# Patient Record
Sex: Male | Born: 1970 | Race: Asian | Hispanic: No | Marital: Married | Smoking: Never smoker
Health system: Southern US, Community
[De-identification: ages and names within clinical notes are randomized; demographics above are authoritative.]

## PROBLEM LIST (undated history)

## (undated) DIAGNOSIS — N410 Acute prostatitis: Secondary | ICD-10-CM

## (undated) DIAGNOSIS — B9681 Helicobacter pylori [H. pylori] as the cause of diseases classified elsewhere: Secondary | ICD-10-CM

## (undated) DIAGNOSIS — J309 Allergic rhinitis, unspecified: Secondary | ICD-10-CM

## (undated) DIAGNOSIS — K259 Gastric ulcer, unspecified as acute or chronic, without hemorrhage or perforation: Secondary | ICD-10-CM

## (undated) DIAGNOSIS — N419 Inflammatory disease of prostate, unspecified: Secondary | ICD-10-CM

## (undated) DIAGNOSIS — E785 Hyperlipidemia, unspecified: Secondary | ICD-10-CM

## (undated) DIAGNOSIS — K219 Gastro-esophageal reflux disease without esophagitis: Secondary | ICD-10-CM

## (undated) DIAGNOSIS — M549 Dorsalgia, unspecified: Secondary | ICD-10-CM

## (undated) DIAGNOSIS — E781 Pure hyperglyceridemia: Secondary | ICD-10-CM

## (undated) DIAGNOSIS — M26629 Arthralgia of temporomandibular joint, unspecified side: Secondary | ICD-10-CM

## (undated) DIAGNOSIS — K2970 Gastritis, unspecified, without bleeding: Secondary | ICD-10-CM

## (undated) HISTORY — DX: Allergic rhinitis, unspecified: J30.9

## (undated) HISTORY — DX: Hyperlipidemia, unspecified: E78.5

## (undated) HISTORY — DX: Gastro-esophageal reflux disease without esophagitis: K21.9

## (undated) HISTORY — DX: Helicobacter pylori (H. pylori) as the cause of diseases classified elsewhere: B96.81

## (undated) HISTORY — DX: Gastric ulcer, unspecified as acute or chronic, without hemorrhage or perforation: K25.9

## (undated) HISTORY — DX: Gastritis, unspecified, without bleeding: K29.70

## (undated) HISTORY — PX: UPPER GASTROINTESTINAL ENDOSCOPY: SHX188

## (undated) HISTORY — DX: Arthralgia of temporomandibular joint, unspecified side: M26.629

## (undated) HISTORY — DX: Pure hyperglyceridemia: E78.1

## (undated) HISTORY — DX: Acute prostatitis: N41.0

---
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
## 2006-06-12 HISTORY — PX: CYSTECTOMY: SUR359

## 2010-05-19 ENCOUNTER — Emergency Department (HOSPITAL_BASED_OUTPATIENT_CLINIC_OR_DEPARTMENT_OTHER): Admission: EM | Admit: 2010-05-19 | Discharge: 2010-04-25 | Payer: Self-pay | Admitting: Emergency Medicine

## 2010-06-23 ENCOUNTER — Ambulatory Visit
Admission: RE | Admit: 2010-06-23 | Discharge: 2010-06-23 | Payer: Self-pay | Source: Home / Self Care | Attending: Internal Medicine | Admitting: Internal Medicine

## 2010-06-23 ENCOUNTER — Encounter: Payer: Self-pay | Admitting: Internal Medicine

## 2010-06-23 DIAGNOSIS — K2970 Gastritis, unspecified, without bleeding: Secondary | ICD-10-CM | POA: Insufficient documentation

## 2010-06-24 ENCOUNTER — Encounter: Payer: Self-pay | Admitting: Internal Medicine

## 2010-06-24 LAB — CONVERTED CEMR LAB: Direct LDL: 68 mg/dL

## 2010-06-27 ENCOUNTER — Telehealth: Payer: Self-pay | Admitting: Internal Medicine

## 2010-06-28 ENCOUNTER — Encounter: Payer: Self-pay | Admitting: Internal Medicine

## 2010-06-28 LAB — CONVERTED CEMR LAB
ALT: 53 units/L (ref 0–53)
AST: 30 units/L (ref 0–37)
Alkaline Phosphatase: 106 units/L (ref 39–117)
Bilirubin, Direct: 0.1 mg/dL (ref 0.0–0.3)
Cholesterol: 169 mg/dL (ref 0–200)
HDL: 44 mg/dL (ref >39)
Helicobacter Pylori Antibody-IgG: <0.4
Total CHOL/HDL Ratio: 3.8
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
## 2010-07-14 NOTE — Progress Notes (Signed)
Summary: Blood Work  ---- [facility] ---- ---- 06/24/2010 4:32 PM, D. Thomos Lemons DO wrote: plz ask pt whether he was completely fasting before recent blood test ------------------------------  Phone Note Outgoing Call   Call placed by: Glendell Docker CMA,  June 27, 2010 11:28 AM Call placed to: Patient Summary of Call: call placed to patient at [phone], no answer. A voice message was left for patient to return call regarding blood work Initial call taken by: Glendell Docker CMA,  June 27, 2010 11:30 AM  Follow-up for Phone Call        Patient returned phone call, and patient states that he was not fasting. [phone]) Follow-up by: Glendell Docker CMA,  June 28, 2010 8:25 AM

## 2010-07-14 NOTE — Letter (Signed)
   [place] [facility] [address] [place], [place]  [address]  [place] Phone: [phone]      June 28, 2010   Asriel Mccune [address] [place], [place] [address]  RE:  LAB RESULTS  Dear  Mr. NUTTALL,  The following is an interpretation of your most recent lab tests.  Please take note of any instructions provided or changes to medications that have resulted from your lab work.  LIVER FUNCTION TESTS:  Good - no changes needed  LIPID PANEL:  Fair - review at your next visit Triglyceride: 409   Cholesterol: 169   LDL: See Comment mg/dL   HDL: 44   Chol/HDL%:  3.8 Ratio  C Reactive protein - normal  H. Pylori antibody - negative       Sincerely Yours,    Dr. Thomos Lemons  Appended Document:  mailed

## 2010-08-23 LAB — COMPREHENSIVE METABOLIC PANEL
ALT: 29 U/L (ref 0–53)
Alkaline Phosphatase: 93 U/L (ref 39–117)
BUN: 15 mg/dL (ref 6–23)
CO2: 28 mEq/L (ref 19–32)
Chloride: 104 mEq/L (ref 96–112)
Glucose, Bld: 99 mg/dL (ref 70–99)
Potassium: 3.3 mEq/L — ABNORMAL LOW (ref 3.5–5.1)
Sodium: 142 mEq/L (ref 135–145)
Total Bilirubin: 0.9 mg/dL (ref 0.3–1.2)

## 2010-08-23 LAB — CBC
HCT: 43.6 % (ref 39.0–52.0)
Hemoglobin: 15.3 g/dL (ref 13.0–17.0)
MCV: 88.2 fL (ref 78.0–100.0)
RBC: 4.94 MIL/uL (ref 4.22–5.81)
WBC: 7.7 10*3/uL (ref 4.0–10.5)

## 2010-08-23 LAB — DIFFERENTIAL
Basophils Absolute: 0 10*3/uL (ref 0.0–0.1)
Basophils Relative: 0 % (ref 0–1)
Eosinophils Absolute: 0.1 10*3/uL (ref 0.0–0.7)
Neutro Abs: 4.8 10*3/uL (ref 1.7–7.7)
Neutrophils Relative %: 62 % (ref 43–77)

## 2010-08-23 LAB — URINALYSIS, ROUTINE W REFLEX MICROSCOPIC
Glucose, UA: NEGATIVE mg/dL
Hgb urine dipstick: NEGATIVE
Protein, ur: NEGATIVE mg/dL
Specific Gravity, Urine: 1.014 (ref 1.005–1.030)
pH: 5.5 (ref 5.0–8.0)

## 2010-08-23 LAB — LIPASE, BLOOD: Lipase: 94 U/L (ref 23–300)

## 2010-11-16 ENCOUNTER — Encounter: Payer: Self-pay | Admitting: Internal Medicine

## 2010-11-22 ENCOUNTER — Ambulatory Visit: Payer: Self-pay | Admitting: Internal Medicine

## 2010-11-29 ENCOUNTER — Telehealth: Payer: Self-pay | Admitting: *Deleted

## 2010-11-29 ENCOUNTER — Encounter: Payer: Self-pay | Admitting: Family

## 2010-11-29 ENCOUNTER — Ambulatory Visit (INDEPENDENT_AMBULATORY_CARE_PROVIDER_SITE_OTHER): Payer: 59 | Admitting: Family

## 2010-11-29 VITALS — BP 118/82 | HR 72 | Temp 98.0°F | Resp 16 | Ht 63.0 in | Wt 131.0 lb

## 2010-11-29 DIAGNOSIS — E781 Pure hyperglyceridemia: Secondary | ICD-10-CM | POA: Insufficient documentation

## 2010-11-29 DIAGNOSIS — E785 Hyperlipidemia, unspecified: Secondary | ICD-10-CM

## 2010-11-29 MED ORDER — OMEGA-3 FATTY ACIDS 1000 MG PO CAPS: 2.0000 g | ORAL_CAPSULE | Freq: Two times a day (BID) | ORAL | Status: AC

## 2010-11-29 MED ORDER — OMEPRAZOLE MAGNESIUM 20 MG PO TBEC: 20.0000 mg | DELAYED_RELEASE_TABLET | Freq: Every day | ORAL | Status: DC
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
## 2010-11-29 NOTE — Telephone Encounter (Signed)
Order entered and forwarded to lab for lipid panel in 3 months per Sandford Craze, NP.

## 2010-11-29 NOTE — Patient Instructions (Signed)
Follow up in 3 months. Go to the lab 1 week prior to this appointment to complete your Fasting Cholesterol.

## 2010-11-29 NOTE — Assessment & Plan Note (Signed)
Triglycerides > 400 last visit. We discussed dietary modification including substituting whole grained bread/rice and limiting portions.  Will add a fish oil supplement and have the pt return for FLP in 3 months.

## 2011-02-28 ENCOUNTER — Other Ambulatory Visit: Payer: Self-pay | Admitting: Internal Medicine

## 2011-02-28 ENCOUNTER — Ambulatory Visit (INDEPENDENT_AMBULATORY_CARE_PROVIDER_SITE_OTHER): Payer: 59 | Admitting: Internal Medicine

## 2011-02-28 ENCOUNTER — Encounter: Payer: Self-pay | Admitting: Internal Medicine

## 2011-02-28 VITALS — BP 106/70 | HR 53 | Temp 97.9°F | Resp 16 | Wt 129.0 lb

## 2011-02-28 DIAGNOSIS — E781 Pure hyperglyceridemia: Secondary | ICD-10-CM

## 2011-02-28 DIAGNOSIS — E785 Hyperlipidemia, unspecified: Secondary | ICD-10-CM

## 2011-02-28 DIAGNOSIS — K219 Gastro-esophageal reflux disease without esophagitis: Secondary | ICD-10-CM

## 2011-02-28 NOTE — Assessment & Plan Note (Signed)
Change to omeprazole for cost consideration. Notify clinic of breakthrough sx's

## 2011-02-28 NOTE — Assessment & Plan Note (Signed)
Obtain lipid/lft. 
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
## 2011-03-01 LAB — LIPID PANEL: LDL Cholesterol: 70 mg/dL (ref 0–99)

## 2011-03-01 LAB — HEPATIC FUNCTION PANEL
AST: 20 U/L (ref 0–37)
Albumin: 4.6 g/dL (ref 3.5–5.2)
Alkaline Phosphatase: 119 U/L — ABNORMAL HIGH (ref 39–117)
Bilirubin, Direct: 0.1 mg/dL (ref 0.0–0.3)
Indirect Bilirubin: 0.7 mg/dL (ref 0.0–0.9)
Total Bilirubin: 0.8 mg/dL (ref 0.3–1.2)

## 2011-06-08 ENCOUNTER — Telehealth: Payer: Self-pay | Admitting: Internal Medicine

## 2011-06-08 NOTE — Telephone Encounter (Signed)
Pt requesting to get back on esomeprazole (NEXIUM) 40 MG capsule pt was recently switched to a new medication and is requesting to get back on esomeprazole (NEXIUM) 40 MG capsule  Pt said new medication is not working  Please contact pt

## 2011-06-09 MED ORDER — ESOMEPRAZOLE MAGNESIUM 40 MG PO CPDR: 40.0000 mg | DELAYED_RELEASE_CAPSULE | Freq: Every day | ORAL | Status: DC

## 2011-06-09 NOTE — Telephone Encounter (Signed)
rx sent in electronically, pt aware 

## 2011-06-09 NOTE — Telephone Encounter (Signed)
Ok to switch back to nexium 40 mg  # 90   One po am 15-30 mins before breakfast.  RF X 1

## 2012-03-13 ENCOUNTER — Telehealth: Payer: Self-pay | Admitting: Internal Medicine

## 2012-03-13 MED ORDER — ESOMEPRAZOLE MAGNESIUM 40 MG PO CPDR: 40.0000 mg | DELAYED_RELEASE_CAPSULE | Freq: Every day | ORAL | Status: DC

## 2012-03-13 NOTE — Telephone Encounter (Signed)
Patient called stating that he need a refill of his nexium sent to [facility]. Please assist.

## 2012-03-18 ENCOUNTER — Other Ambulatory Visit: Payer: Self-pay | Admitting: Internal Medicine

## 2012-04-11 ENCOUNTER — Ambulatory Visit (INDEPENDENT_AMBULATORY_CARE_PROVIDER_SITE_OTHER): Payer: 59 | Admitting: Internal Medicine

## 2012-04-11 ENCOUNTER — Encounter: Payer: Self-pay | Admitting: Internal Medicine

## 2012-04-11 VITALS — BP 122/84 | HR 56 | Temp 97.9°F | Resp 14 | Ht 63.5 in | Wt 130.2 lb

## 2012-04-11 DIAGNOSIS — E781 Pure hyperglyceridemia: Secondary | ICD-10-CM

## 2012-04-11 DIAGNOSIS — R109 Unspecified abdominal pain: Secondary | ICD-10-CM

## 2012-04-11 DIAGNOSIS — E785 Hyperlipidemia, unspecified: Secondary | ICD-10-CM

## 2012-04-11 DIAGNOSIS — K2970 Gastritis, unspecified, without bleeding: Secondary | ICD-10-CM

## 2012-04-11 LAB — HEPATIC FUNCTION PANEL
AST: 27 U/L (ref 0–37)
Albumin: 4.4 g/dL (ref 3.5–5.2)
Bilirubin, Direct: 0.2 mg/dL (ref 0.0–0.3)
Total Bilirubin: 0.9 mg/dL (ref 0.3–1.2)

## 2012-04-11 LAB — CBC WITH DIFFERENTIAL/PLATELET
Basophils Absolute: 0.1 10*3/uL (ref 0.0–0.1)
HCT: 43.1 % (ref 39.0–52.0)
Lymphocytes Relative: 42 % (ref 12–46)
Lymphs Abs: 2.6 10*3/uL (ref 0.7–4.0)
MCV: 86 fL (ref 78.0–100.0)
Monocytes Absolute: 0.5 10*3/uL (ref 0.1–1.0)
Neutro Abs: 2.9 10*3/uL (ref 1.7–7.7)
RBC: 5.01 MIL/uL (ref 4.22–5.81)
RDW: 13 % (ref 11.5–15.5)
WBC: 6.1 10*3/uL (ref 4.0–10.5)

## 2012-04-11 LAB — BASIC METABOLIC PANEL
BUN: 12 mg/dL (ref 6–23)
CO2: 30 mEq/L (ref 19–32)
Chloride: 101 mEq/L (ref 96–112)
Potassium: 4.1 mEq/L (ref 3.5–5.3)

## 2012-04-11 LAB — LIPID PANEL
HDL: 43 mg/dL (ref >39)
LDL Cholesterol: 77 mg/dL (ref 0–99)
Total CHOL/HDL Ratio: 4 Ratio
VLDL: 54 mg/dL — ABNORMAL HIGH (ref 0–40)

## 2012-04-11 MED ORDER — ESOMEPRAZOLE MAGNESIUM 40 MG PO CPDR: 40.0000 mg | DELAYED_RELEASE_CAPSULE | Freq: Every day | ORAL | Status: DC
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
## 2012-04-14 NOTE — Assessment & Plan Note (Signed)
Continue Nexium and refill provided. Followup if symptoms do not resolve over next two weeks.

## 2012-04-14 NOTE — Assessment & Plan Note (Signed)
Obtain fasting lipid profile and liver function tests. 

## 2012-04-24 ENCOUNTER — Encounter: Payer: Self-pay | Admitting: *Deleted

## 2012-04-28 ENCOUNTER — Encounter (HOSPITAL_COMMUNITY): Payer: Self-pay | Admitting: Emergency Medicine

## 2012-04-28 ENCOUNTER — Emergency Department (HOSPITAL_COMMUNITY)
Admission: EM | Admit: 2012-04-28 | Discharge: 2012-04-28 | Disposition: A | Discharge status: Home / Self Care | Payer: 59 | Source: Home / Self Care

## 2012-04-28 DIAGNOSIS — S0500XA Injury of conjunctiva and corneal abrasion without foreign body, unspecified eye, initial encounter: Secondary | ICD-10-CM

## 2012-04-28 DIAGNOSIS — S058X9A Other injuries of unspecified eye and orbit, initial encounter: Secondary | ICD-10-CM

## 2012-04-28 MED ORDER — HYDROCODONE-ACETAMINOPHEN 5-325 MG PO TABS: 2.0000 | ORAL_TABLET | ORAL | Status: DC | PRN

## 2012-04-28 MED ORDER — TOBRAMYCIN 0.3 % OP SOLN: 1.0000 [drp] | OPHTHALMIC | Status: DC

## 2012-04-28 NOTE — ED Notes (Signed)
Reports foreign object hit eye.  Reports watery eyes and pain.

## 2012-04-28 NOTE — ED Provider Notes (Signed)
Medical screening examination/treatment/procedure(s) were performed by non-physician practitioner and as supervising physician I was immediately available for consultation/collaboration.  Leslee Home, M.D.   Reuben Likes, MD 04/28/12 [phone]
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
## 2012-06-14 ENCOUNTER — Encounter: Payer: Self-pay | Admitting: Internal Medicine

## 2012-06-14 ENCOUNTER — Ambulatory Visit (INDEPENDENT_AMBULATORY_CARE_PROVIDER_SITE_OTHER): Payer: 59 | Admitting: Internal Medicine

## 2012-06-14 VITALS — BP 110/80 | HR 63 | Temp 98.2°F | Resp 16 | Wt 131.2 lb

## 2012-06-14 DIAGNOSIS — N419 Inflammatory disease of prostate, unspecified: Secondary | ICD-10-CM

## 2012-06-14 DIAGNOSIS — R300 Dysuria: Secondary | ICD-10-CM

## 2012-06-14 DIAGNOSIS — R82998 Other abnormal findings in urine: Secondary | ICD-10-CM

## 2012-06-14 DIAGNOSIS — R350 Frequency of micturition: Secondary | ICD-10-CM

## 2012-06-14 DIAGNOSIS — R8290 Unspecified abnormal findings in urine: Secondary | ICD-10-CM

## 2012-06-14 HISTORY — DX: Inflammatory disease of prostate, unspecified: N41.9

## 2012-06-14 LAB — POCT URINALYSIS DIPSTICK
Bilirubin, UA: NEGATIVE
Glucose, UA: NEGATIVE
Ketones, UA: NEGATIVE
Leukocytes, UA: NEGATIVE
Nitrite, UA: NEGATIVE
Protein, UA: NEGATIVE
Spec Grav, UA: 1.015
Urobilinogen, UA: 0.2
pH, UA: 6.5

## 2012-06-14 MED ORDER — ESOMEPRAZOLE MAGNESIUM 40 MG PO CPDR: 40.0000 mg | DELAYED_RELEASE_CAPSULE | Freq: Every day | ORAL | Status: DC

## 2012-06-14 MED ORDER — CIPROFLOXACIN HCL 500 MG PO TABS: 500.0000 mg | ORAL_TABLET | Freq: Two times a day (BID) | ORAL | Status: DC

## 2012-06-14 NOTE — Assessment & Plan Note (Signed)
Suspected prostatitis. ua remarkable for blood only. No hx of kidney stones. Send urine for cx. Begin 10d course of cipro. Followup if no improvement or worsening.
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
## 2012-06-14 NOTE — Addendum Note (Signed)
Addended by: Regis Bill on: 06/14/2012 10:00 AM   Modules accepted: Orders

## 2012-06-16 LAB — URINE CULTURE
Colony Count: NO GROWTH
Organism ID, Bacteria: NO GROWTH

## 2012-09-20 ENCOUNTER — Encounter: Payer: Self-pay | Admitting: Internal Medicine

## 2012-10-01 ENCOUNTER — Encounter: Payer: Self-pay | Admitting: Family

## 2012-10-01 ENCOUNTER — Ambulatory Visit (INDEPENDENT_AMBULATORY_CARE_PROVIDER_SITE_OTHER): Payer: 59 | Admitting: Family

## 2012-10-01 VITALS — BP 124/90 | HR 69 | Temp 97.9°F | Resp 16 | Ht 63.5 in | Wt 129.0 lb

## 2012-10-01 DIAGNOSIS — J309 Allergic rhinitis, unspecified: Secondary | ICD-10-CM

## 2012-10-01 MED ORDER — FLUTICASONE PROPIONATE 50 MCG/ACT NA SUSP: 2.0000 | Freq: Every day | NASAL | Status: DC

## 2012-10-01 NOTE — Patient Instructions (Addendum)
Allergic Rhinitis  Allergic rhinitis is when the mucous membranes in the nose respond to allergens. Allergens are particles in the air that cause your body to have an allergic reaction. This causes you to release allergic antibodies. Through a chain of events, these eventually cause you to release histamine into the blood stream (hence the use of antihistamines). Although meant to be protective to the body, it is this release that causes your discomfort, such as frequent sneezing, congestion and an itchy runny nose.    CAUSES    The pollen allergens may come from grasses, trees, and weeds. This is seasonal allergic rhinitis, or "hay fever." Other allergens cause year-round allergic rhinitis (perennial allergic rhinitis) such as house dust mite allergen, pet dander and mold spores.    SYMPTOMS     Nasal stuffiness (congestion).   Runny, itchy nose with sneezing and tearing of the eyes.   There is often an itching of the mouth, eyes and ears.  It cannot be cured, but it can be controlled with medications.  DIAGNOSIS    If you are unable to determine the offending allergen, skin or blood testing may find it.  TREATMENT     Avoid the allergen.   Medications and allergy shots (immunotherapy) can help.   Hay fever may often be treated with antihistamines in pill or nasal spray forms. Antihistamines block the effects of histamine. There are over-the-counter medicines that may help with nasal congestion and swelling around the eyes. Check with your caregiver before taking or giving this medicine.  If the treatment above does not work, there are many new medications your caregiver can prescribe. Stronger medications may be used if initial measures are ineffective. Desensitizing injections can be used if medications and avoidance fails. Desensitization is when a patient is given ongoing shots until the body becomes less sensitive to the allergen. Make sure you follow up with your caregiver if problems continue.   SEEK MEDICAL CARE IF:     You develop fever (more than 100.5 F (38.1 C).   You develop a cough that does not stop easily (persistent).   You have shortness of breath.   You start wheezing.   Symptoms interfere with normal daily activities.  Document Released: 02/21/2001 Document Revised: 08/21/2011 Document Reviewed: 09/02/2008  Exit[employer] Patient Information 2013 [employer].
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
## 2012-10-05 DIAGNOSIS — J309 Allergic rhinitis, unspecified: Secondary | ICD-10-CM | POA: Insufficient documentation

## 2012-10-05 NOTE — Assessment & Plan Note (Signed)
Change to zyrtec, add flonase.  Call if symptoms worsen or do not improve.

## 2012-10-10 ENCOUNTER — Ambulatory Visit: Payer: 59 | Admitting: Internal Medicine

## 2012-10-11 ENCOUNTER — Encounter: Payer: Self-pay | Admitting: Family

## 2012-10-11 ENCOUNTER — Ambulatory Visit (INDEPENDENT_AMBULATORY_CARE_PROVIDER_SITE_OTHER): Payer: 59 | Admitting: Family

## 2012-10-11 VITALS — BP 126/82 | HR 62 | Temp 97.9°F | Resp 16 | Ht 63.5 in | Wt 130.0 lb

## 2012-10-11 DIAGNOSIS — E781 Pure hyperglyceridemia: Secondary | ICD-10-CM

## 2012-10-11 DIAGNOSIS — J309 Allergic rhinitis, unspecified: Secondary | ICD-10-CM

## 2012-10-11 DIAGNOSIS — K219 Gastro-esophageal reflux disease without esophagitis: Secondary | ICD-10-CM

## 2012-10-11 NOTE — Assessment & Plan Note (Signed)
Stable with zyrtec only.

## 2012-10-11 NOTE — Patient Instructions (Addendum)
Please follow up in 3 months for a complete physical.

## 2012-10-11 NOTE — Assessment & Plan Note (Signed)
Stable, only using nexium prn.

## 2012-10-11 NOTE — Assessment & Plan Note (Signed)
Pt is on fish oil and working on diet.  He will return fasting for FLP.
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
## 2012-10-16 LAB — LIPID PANEL
HDL: 46 mg/dL (ref >39)
Triglycerides: 120 mg/dL (ref <150)

## 2012-10-17 ENCOUNTER — Encounter: Payer: Self-pay | Admitting: Family

## 2013-03-10 ENCOUNTER — Encounter: Payer: Self-pay | Admitting: Family

## 2013-03-10 ENCOUNTER — Ambulatory Visit (INDEPENDENT_AMBULATORY_CARE_PROVIDER_SITE_OTHER): Payer: 59 | Admitting: Family

## 2013-03-10 VITALS — BP 106/80 | HR 55 | Temp 98.1°F | Resp 16 | Ht 63.5 in | Wt 124.1 lb

## 2013-03-10 DIAGNOSIS — M26629 Arthralgia of temporomandibular joint, unspecified side: Secondary | ICD-10-CM

## 2013-03-10 MED ORDER — CYCLOBENZAPRINE HCL 5 MG PO TABS: 5.0000 mg | ORAL_TABLET | Freq: Every day | ORAL | Status: DC

## 2013-03-10 MED ORDER — MELOXICAM 7.5 MG PO TABS: 7.5000 mg | ORAL_TABLET | Freq: Every day | ORAL | Status: DC

## 2013-03-10 NOTE — Assessment & Plan Note (Signed)
Trial of meloxicam, HS flexeril and HS otc mouth guard.  If not improvement could consider neck ultrasound to further evaluate.

## 2013-03-10 NOTE — Patient Instructions (Addendum)
Purchase a night time mouth guard at the drug store and wear each night while sleeping. Take meloxicam daily for 1-2 weeks- this is an anti-inflammatory. You may use flexeril at bedtime.   Follow up in 1 month.   Temporomandibular Joint Pain Your exam shows that you have a problem with your temporomandibular joint (TMJ), the joint that moves when you open your mouth or chew food. TMJ problems can result from direct injuries, bite abnormalities, or tension states which cause you to grind or clench your teeth. Typical symptoms include pain around the joint, clicking, restricted movement, and headaches. The TMJ is like any other joint in the body; when it is strained, it needs rest to repair itself. To keep the joint at rest it is important that you do not open your mouth wider than the width of your index finger. If you must yawn, be sure to support your chin with your hand so your mouth does not open wide. Eat a soft diet (nothing firmer than ground beef, no raw vegetables), do not chew gum and do not talk if it causes you pain. Apply topical heat by using a warm, moist cloth placed in front of the ear for 15 to 20 minutes several times daily. Alternating heat and ice may give even more relief. Anti-inflammatory pain medicine and muscle relaxants can also be helpful. A dental orthotic or splint may be used for temporary relief. Long-term problems may require treatment for stress as well as braces or surgery. Please check with your doctor or dentist if your symptoms do not improve within one week. Document Released: 07/06/2004 Document Revised: 08/21/2011 Document Reviewed: 05/29/2005 [facility] Patient Information 2014 [place], [place].
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
## 2013-04-19 ENCOUNTER — Emergency Department (HOSPITAL_COMMUNITY)
Admission: EM | Admit: 2013-04-19 | Discharge: 2013-04-19 | Disposition: A | Discharge status: Home / Self Care | Payer: 59 | Source: Home / Self Care | Attending: Family Medicine | Admitting: Family Medicine

## 2013-04-19 ENCOUNTER — Encounter (HOSPITAL_COMMUNITY): Payer: Self-pay | Admitting: Emergency Medicine

## 2013-04-19 DIAGNOSIS — J312 Chronic pharyngitis: Secondary | ICD-10-CM

## 2013-04-19 MED ORDER — CLINDAMYCIN HCL 300 MG PO CAPS: 300.0000 mg | ORAL_CAPSULE | Freq: Three times a day (TID) | ORAL | Status: DC

## 2013-04-19 NOTE — ED Notes (Signed)
Pt  Reports  Swelling  l  Side  Of  Neck   With  Pain  When  He  Swallows       X  1  Week   -  Pt  Has  Had  The  Problem  Off  And  On  For       10  Years         Sitting  Upright on  Exam table  In no  Acute  Distress
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
## 2013-04-22 LAB — CULTURE, GROUP A STREP

## 2013-10-10 ENCOUNTER — Emergency Department (HOSPITAL_BASED_OUTPATIENT_CLINIC_OR_DEPARTMENT_OTHER): Payer: 59

## 2013-10-10 ENCOUNTER — Emergency Department (HOSPITAL_BASED_OUTPATIENT_CLINIC_OR_DEPARTMENT_OTHER)
Admission: EM | Admit: 2013-10-10 | Discharge: 2013-10-10 | Disposition: A | Discharge status: Home / Self Care | Payer: 59 | Attending: Emergency Medicine | Admitting: Emergency Medicine

## 2013-10-10 ENCOUNTER — Encounter (HOSPITAL_BASED_OUTPATIENT_CLINIC_OR_DEPARTMENT_OTHER): Payer: Self-pay | Admitting: Emergency Medicine

## 2013-10-10 DIAGNOSIS — Z791 Long term (current) use of non-steroidal anti-inflammatories (NSAID): Secondary | ICD-10-CM | POA: Insufficient documentation

## 2013-10-10 DIAGNOSIS — J209 Acute bronchitis, unspecified: Secondary | ICD-10-CM | POA: Insufficient documentation

## 2013-10-10 DIAGNOSIS — Z792 Long term (current) use of antibiotics: Secondary | ICD-10-CM | POA: Insufficient documentation

## 2013-10-10 DIAGNOSIS — Z8619 Personal history of other infectious and parasitic diseases: Secondary | ICD-10-CM | POA: Insufficient documentation

## 2013-10-10 DIAGNOSIS — Z8719 Personal history of other diseases of the digestive system: Secondary | ICD-10-CM | POA: Insufficient documentation

## 2013-10-10 DIAGNOSIS — Z87828 Personal history of other (healed) physical injury and trauma: Secondary | ICD-10-CM | POA: Insufficient documentation

## 2013-10-10 DIAGNOSIS — Z79899 Other long term (current) drug therapy: Secondary | ICD-10-CM | POA: Insufficient documentation

## 2013-10-10 MED ORDER — ALBUTEROL SULFATE HFA 108 (90 BASE) MCG/ACT IN AERS
2.0000 | INHALATION_SPRAY | RESPIRATORY_TRACT | Status: DC | PRN
  Administered 2013-10-10: 2 via RESPIRATORY_TRACT
  Filled 2013-10-10: qty 6.7

## 2013-10-10 MED ORDER — ALBUTEROL SULFATE (2.5 MG/3ML) 0.083% IN NEBU
2.5000 mg | INHALATION_SOLUTION | Freq: Once | RESPIRATORY_TRACT | Status: AC
  Administered 2013-10-10: 5 mg via RESPIRATORY_TRACT

## 2013-10-10 MED ORDER — IPRATROPIUM-ALBUTEROL 0.5-2.5 (3) MG/3ML IN SOLN: 3.0000 mL | Freq: Once | RESPIRATORY_TRACT | Status: DC

## 2013-10-10 MED ORDER — DEXAMETHASONE 10 MG/ML FOR PEDIATRIC ORAL USE
10.0000 mg | Freq: Once | INTRAMUSCULAR | Status: AC
  Administered 2013-10-10: 10 mg via ORAL
  Filled 2013-10-10: qty 1

## 2013-10-10 MED ORDER — ALBUTEROL SULFATE (2.5 MG/3ML) 0.083% IN NEBU
INHALATION_SOLUTION | RESPIRATORY_TRACT | Status: AC
  Administered 2013-10-10: 5 mg via RESPIRATORY_TRACT
  Filled 2013-10-10: qty 6

## 2013-10-10 MED ORDER — DEXAMETHASONE SODIUM PHOSPHATE 10 MG/ML IJ SOLN
INTRAMUSCULAR | Status: AC
  Filled 2013-10-10: qty 1

## 2013-10-10 MED ORDER — IPRATROPIUM BROMIDE 0.02 % IN SOLN
RESPIRATORY_TRACT | Status: AC
  Administered 2013-10-10: 0.5 mg
  Filled 2013-10-10: qty 2.5

## 2013-10-10 NOTE — Discharge Instructions (Signed)
Acute Bronchitis Bronchitis is inflammation of the airways that extend from the windpipe into the lungs (bronchi). The inflammation often causes mucus to develop. This leads to a cough, which is the most common symptom of bronchitis.  In acute bronchitis, the condition usually develops suddenly and goes away over time, usually in a couple weeks. Smoking, allergies, and asthma can make bronchitis worse. Repeated episodes of bronchitis may cause further lung problems.  CAUSES Acute bronchitis is most often caused by the same virus that causes a cold. The virus can spread from person to person (contagious).  SIGNS AND SYMPTOMS   Cough.   Fever.   Coughing up mucus.   Body aches.   Chest congestion.   Chills.   Shortness of breath.   Sore throat.  DIAGNOSIS  Acute bronchitis is usually diagnosed through a physical exam. Tests, such as chest X-rays, are sometimes done to rule out other conditions.  TREATMENT  Acute bronchitis usually goes away in a couple weeks. Often times, no medical treatment is necessary. Medicines are sometimes given for relief of fever or cough. Antibiotics are usually not needed but may be prescribed in certain situations. In some cases, an inhaler may be recommended to help reduce shortness of breath and control the cough. A cool mist vaporizer may also be used to help thin bronchial secretions and make it easier to clear the chest.  HOME CARE INSTRUCTIONS  Get plenty of rest.   Drink enough fluids to keep your urine clear or pale yellow (unless you have a medical condition that requires fluid restriction). Increasing fluids may help thin your secretions and will prevent dehydration.   Only take over-the-counter or prescription medicines as directed by your health care provider.   Avoid smoking and secondhand smoke. Exposure to cigarette smoke or irritating chemicals will make bronchitis worse. If you are a smoker, consider using nicotine gum or skin  patches to help control withdrawal symptoms. Quitting smoking will help your lungs heal faster.   Reduce the chances of another bout of acute bronchitis by washing your hands frequently, avoiding people with cold symptoms, and trying not to touch your hands to your mouth, nose, or eyes.   Follow up with your health care provider as directed.  SEEK MEDICAL CARE IF: Your symptoms do not improve after 1 week of treatment.  SEEK IMMEDIATE MEDICAL CARE IF:  You develop an increased fever or chills.   You have chest pain.   You have severe shortness of breath.  You have bloody sputum.   You develop dehydration.  You develop fainting.  You develop repeated vomiting.  You develop a severe headache. MAKE SURE YOU:   Understand these instructions.  Will watch your condition.  Will get help right away if you are not doing well or get worse. Document Released: 07/06/2004 Document Revised: 01/29/2013 Document Reviewed: 11/19/2012 [employer] Patient Information 2014 [employer].  
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
## 2013-10-10 NOTE — ED Notes (Signed)
Productive cough x3 weeks;

## 2013-10-21 ENCOUNTER — Ambulatory Visit (INDEPENDENT_AMBULATORY_CARE_PROVIDER_SITE_OTHER): Payer: 59 | Admitting: Family

## 2013-10-21 ENCOUNTER — Encounter: Payer: Self-pay | Admitting: Family

## 2013-10-21 VITALS — BP 100/78 | HR 54 | Temp 97.8°F | Resp 16 | Ht 63.5 in | Wt 134.0 lb

## 2013-10-21 DIAGNOSIS — J40 Bronchitis, not specified as acute or chronic: Secondary | ICD-10-CM
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
## 2013-10-21 NOTE — Progress Notes (Signed)
Pre visit review using our clinic review tool, if applicable. No additional management support is needed unless otherwise documented below in the visit note. 

## 2013-10-21 NOTE — Patient Instructions (Signed)
Please schedule fasting physical at the front desk.

## 2013-10-22 DIAGNOSIS — J45909 Unspecified asthma, uncomplicated: Secondary | ICD-10-CM | POA: Insufficient documentation

## 2013-10-22 NOTE — Assessment & Plan Note (Signed)
Clinically resolved. Advised pt to wear mask when mowing law in future. Continue use of prn albuterol.

## 2013-11-04 ENCOUNTER — Emergency Department (HOSPITAL_BASED_OUTPATIENT_CLINIC_OR_DEPARTMENT_OTHER): Payer: 59

## 2013-11-04 ENCOUNTER — Emergency Department (HOSPITAL_BASED_OUTPATIENT_CLINIC_OR_DEPARTMENT_OTHER)
Admission: EM | Admit: 2013-11-04 | Discharge: 2013-11-04 | Discharge status: Against Medical Advice | Payer: 59 | Attending: Emergency Medicine | Admitting: Emergency Medicine

## 2013-11-04 ENCOUNTER — Encounter: Payer: Self-pay | Admitting: Family

## 2013-11-04 ENCOUNTER — Ambulatory Visit (INDEPENDENT_AMBULATORY_CARE_PROVIDER_SITE_OTHER): Payer: 59 | Admitting: Family

## 2013-11-04 ENCOUNTER — Encounter (HOSPITAL_BASED_OUTPATIENT_CLINIC_OR_DEPARTMENT_OTHER): Payer: Self-pay | Admitting: Emergency Medicine

## 2013-11-04 VITALS — BP 102/74 | HR 54 | Temp 98.0°F | Resp 18 | Ht 63.5 in | Wt 133.0 lb

## 2013-11-04 DIAGNOSIS — R0789 Other chest pain: Secondary | ICD-10-CM | POA: Insufficient documentation

## 2013-11-04 DIAGNOSIS — R079 Chest pain, unspecified: Secondary | ICD-10-CM

## 2013-11-04 DIAGNOSIS — Z87828 Personal history of other (healed) physical injury and trauma: Secondary | ICD-10-CM | POA: Insufficient documentation

## 2013-11-04 DIAGNOSIS — Z0000 Encounter for general adult medical examination without abnormal findings: Secondary | ICD-10-CM

## 2013-11-04 DIAGNOSIS — Z79899 Other long term (current) drug therapy: Secondary | ICD-10-CM | POA: Insufficient documentation

## 2013-11-04 DIAGNOSIS — Z8719 Personal history of other diseases of the digestive system: Secondary | ICD-10-CM | POA: Insufficient documentation

## 2013-11-04 HISTORY — DX: Encounter for general adult medical examination without abnormal findings: Z00.00

## 2013-11-04 LAB — CBC WITH DIFFERENTIAL/PLATELET
BASOS ABS: 0.1 10*3/uL (ref 0.0–0.1)
BASOS PCT: 1 % (ref 0–1)
EOS PCT: 4 % (ref 0–5)
Eosinophils Absolute: 0.2 10*3/uL (ref 0.0–0.7)
HCT: 42.9 % (ref 39.0–52.0)
Hemoglobin: 15 g/dL (ref 13.0–17.0)
Lymphocytes Relative: 54 % — ABNORMAL HIGH (ref 12–46)
Lymphs Abs: 3.1 10*3/uL (ref 0.7–4.0)
MCH: 31.1 pg (ref 26.0–34.0)
MCHC: 35 g/dL (ref 30.0–36.0)
MCV: 89 fL (ref 78.0–100.0)
MONO ABS: 0.5 10*3/uL (ref 0.1–1.0)
Monocytes Relative: 8 % (ref 3–12)
Neutro Abs: 1.9 10*3/uL (ref 1.7–7.7)
Neutrophils Relative %: 33 % — ABNORMAL LOW (ref 43–77)
Platelets: 176 10*3/uL (ref 150–400)
RBC: 4.82 MIL/uL (ref 4.22–5.81)
RDW: 12.4 % (ref 11.5–15.5)
WBC: 5.8 10*3/uL (ref 4.0–10.5)

## 2013-11-04 LAB — COMPREHENSIVE METABOLIC PANEL
ALBUMIN: 4.1 g/dL (ref 3.5–5.2)
ALT: 26 U/L (ref 0–53)
AST: 25 U/L (ref 0–37)
Alkaline Phosphatase: 88 U/L (ref 39–117)
BUN: 14 mg/dL (ref 6–23)
CO2: 24 mEq/L (ref 19–32)
CREATININE: 1.1 mg/dL (ref 0.50–1.35)
Calcium: 9.5 mg/dL (ref 8.4–10.5)
Chloride: 103 mEq/L (ref 96–112)
GFR calc Af Amer: >90 mL/min (ref >90)
GFR calc non Af Amer: 81 mL/min — ABNORMAL LOW (ref >90)
Glucose, Bld: 116 mg/dL — ABNORMAL HIGH (ref 70–99)
Potassium: 3.9 mEq/L (ref 3.7–5.3)
Sodium: 141 mEq/L (ref 137–147)
Total Bilirubin: 1.1 mg/dL (ref 0.3–1.2)
Total Protein: 7.4 g/dL (ref 6.0–8.3)

## 2013-11-04 LAB — TROPONIN I

## 2013-11-04 MED ORDER — ASPIRIN 81 MG PO CHEW
324.0000 mg | CHEWABLE_TABLET | Freq: Once | ORAL | Status: AC
  Administered 2013-11-04: 324 mg via ORAL
  Filled 2013-11-04: qty 4

## 2013-11-04 NOTE — Discharge Instructions (Signed)
Chest Pain (Nonspecific) There is no evidence of heart attack or blood clot in the lung. Follow up with your doctor for a stress test. Return to the ED if you develop new or worsening symptoms. It is often hard to give a specific diagnosis for the cause of chest pain. There is always a chance that your pain could be related to something serious, such as a heart attack or a blood clot in the lungs. You need to follow up with your caregiver for further evaluation. CAUSES   Heartburn.  Pneumonia or bronchitis.  Anxiety or stress.  Inflammation around your heart (pericarditis) or lung (pleuritis or pleurisy).  A blood clot in the lung.  A collapsed lung (pneumothorax). It can develop suddenly on its own (spontaneous pneumothorax) or from injury (trauma) to the chest.  Shingles infection (herpes zoster virus). The chest wall is composed of bones, muscles, and cartilage. Any of these can be the source of the pain.  The bones can be bruised by injury.  The muscles or cartilage can be strained by coughing or overwork.  The cartilage can be affected by inflammation and become sore (costochondritis). DIAGNOSIS  Lab tests or other studies, such as X-rays, electrocardiography, stress testing, or cardiac imaging, may be needed to find the cause of your pain.  TREATMENT   Treatment depends on what may be causing your chest pain. Treatment may include:  Acid blockers for heartburn.  Anti-inflammatory medicine.  Pain medicine for inflammatory conditions.  Antibiotics if an infection is present.  You may be advised to change lifestyle habits. This includes stopping smoking and avoiding alcohol, caffeine, and chocolate.  You may be advised to keep your head raised (elevated) when sleeping. This reduces the chance of acid going backward from your stomach into your esophagus.  Most of the time, nonspecific chest pain will improve within 2 to 3 days with rest and mild pain medicine. HOME CARE  INSTRUCTIONS   If antibiotics were prescribed, take your antibiotics as directed. Finish them even if you start to feel better.  For the next few days, avoid physical activities that bring on chest pain. Continue physical activities as directed.  Do not smoke.  Avoid drinking alcohol.  Only take over-the-counter or prescription medicine for pain, discomfort, or fever as directed by your caregiver.  Follow your caregiver's suggestions for further testing if your chest pain does not go away.  Keep any follow-up appointments you made. If you do not go to an appointment, you could develop lasting (chronic) problems with pain. If there is any problem keeping an appointment, you must call to reschedule. SEEK MEDICAL CARE IF:   You think you are having problems from the medicine you are taking. Read your medicine instructions carefully.  Your chest pain does not go away, even after treatment.  You develop a rash with blisters on your chest. SEEK IMMEDIATE MEDICAL CARE IF:   You have increased chest pain or pain that spreads to your arm, neck, jaw, back, or abdomen.  You develop shortness of breath, an increasing cough, or you are coughing up blood.  You have severe back or abdominal pain, feel nauseous, or vomit.  You develop severe weakness, fainting, or chills.  You have a fever. THIS IS AN EMERGENCY. Do not wait to see if the pain will go away. Get medical help at once. Call your local emergency services (911 in [place] Do not drive yourself to the hospital. MAKE SURE YOU:   Understand these instructions.  Will watch your condition.  Will get help right away if you are not doing well or get worse. Document Released: 03/08/2005 Document Revised: 08/21/2011 Document Reviewed: 01/02/2008 [facility] Patient Information 2014 [place].

## 2013-11-04 NOTE — ED Notes (Signed)
Paged the [facility] doctor--return call to [phone]

## 2013-11-04 NOTE — ED Notes (Addendum)
Developed intermittent CP that is described as sharp, left chest wall radiating to left arm. Also reports he felt dizzy and like he was going to pass out Friday while at work. Seen by PMD this am for yearly visit and sent to ED for further evaluation.

## 2013-11-04 NOTE — Progress Notes (Signed)
Pre visit review using our clinic review tool, if applicable. No additional management support is needed unless otherwise documented below in the visit note. 

## 2013-11-04 NOTE — Assessment & Plan Note (Signed)
EKG is performed today and notes anterolateral ST elevation.

## 2013-11-04 NOTE — Assessment & Plan Note (Signed)
We discussed healthy diet, exercise and maintaining healthy body weight.  Obtain fasting lab work.

## 2013-11-04 NOTE — ED Notes (Signed)
MD at bedside. 
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
## 2013-11-04 NOTE — Patient Instructions (Addendum)
Please proceed to the ED on the first floor for further evaluation of your chest pain. Return at your convenience to complete fasting lab work for your physical.
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
## 2013-11-14 ENCOUNTER — Ambulatory Visit (INDEPENDENT_AMBULATORY_CARE_PROVIDER_SITE_OTHER): Payer: 59 | Admitting: Family

## 2013-11-14 ENCOUNTER — Encounter: Payer: Self-pay | Admitting: Family

## 2013-11-14 VITALS — BP 110/80 | HR 58 | Temp 98.0°F | Resp 16 | Ht 63.5 in | Wt 130.0 lb

## 2013-11-14 DIAGNOSIS — R0789 Other chest pain: Secondary | ICD-10-CM

## 2013-11-14 NOTE — Assessment & Plan Note (Signed)
Recommended short course of PRN aleve.  Refer for stress test. Doubt cardiac etiology. Advised pt to return to the ED if severe/worsening chest pain.

## 2013-11-14 NOTE — Patient Instructions (Addendum)
You will be contacted about your referral for stress test. Go to ER if you develop severe/worsening chest pain.  Follow up in 2 months.
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
## 2013-11-14 NOTE — Progress Notes (Signed)
Pre visit review using our clinic review tool, if applicable. No additional management support is needed unless otherwise documented below in the visit note. 

## 2013-11-17 ENCOUNTER — Ambulatory Visit: Payer: 59 | Admitting: Family

## 2013-11-26 ENCOUNTER — Ambulatory Visit (HOSPITAL_COMMUNITY): Payer: 59 | Attending: Family | Admitting: Radiology

## 2013-11-26 VITALS — BP 129/76 | HR 58 | Ht 66.0 in | Wt 128.0 lb

## 2013-11-26 DIAGNOSIS — Z8249 Family history of ischemic heart disease and other diseases of the circulatory system: Secondary | ICD-10-CM | POA: Insufficient documentation

## 2013-11-26 DIAGNOSIS — R079 Chest pain, unspecified: Secondary | ICD-10-CM

## 2013-11-26 DIAGNOSIS — R0789 Other chest pain: Secondary | ICD-10-CM | POA: Insufficient documentation

## 2013-11-26 MED ORDER — TECHNETIUM TC 99M SESTAMIBI GENERIC - CARDIOLITE
11.0000 | Freq: Once | INTRAVENOUS | Status: AC | PRN
  Administered 2013-11-26: 11 via INTRAVENOUS

## 2013-11-26 MED ORDER — TECHNETIUM TC 99M SESTAMIBI GENERIC - CARDIOLITE
33.0000 | Freq: Once | INTRAVENOUS | Status: AC | PRN
  Administered 2013-11-26: 33 via INTRAVENOUS

## 2013-11-26 NOTE — Progress Notes (Signed)
[facility] [address] [place], [place] [address] [phone]    Cardiology Nuclear Med Study  Craig Ramos is a 43 y.o. male     MRN : [id number]     DOB: 1970-09-19  Procedure Date: 11/26/2013  Nuclear Med Background Indication for Stress Test:  Evaluation for Ischemia, and Patient seen in hospital on 11-04-2013 for Chest Pain, Enzymes negative History:  No known CAD, hx. bronchitis Cardiac Risk Factors: Family History - CAD  Symptoms:  Chest Pain (last date of chest discomfort was two weeks ago)   Nuclear Pre-Procedure Caffeine/Decaff Intake:  None> 12 hrs NPO After: 7:00am (cereal)   Lungs:  clear O2 Sat: 99% on room air. IV 0.9% NS with Angio Cath:  22g  IV Site: R Antecubital x 1, tolerated well IV Started by:  Craig HongPatsy Edwards, RN  Chest Size (in):  38 Cup Size: n/a  Height: 5\' 6"  (1.676 m)  Weight:  128 lb (58.06 kg)  BMI:  Body mass index is 20.67 kg/(m^2). Tech Comments:  N/A    Nuclear Med Study 1 or 2 day study: 1 day  Stress Test Type:  Stress  Reading MD: N/A  Order Authorizing Provider:  Danise EdgeStacey Blyth, MD  Resting Radionuclide: Technetium 6818m Sestamibi  Resting Radionuclide Dose: 11.0 mCi   Stress Radionuclide:  Technetium 1518m Sestamibi  Stress Radionuclide Dose: 33.0 mCi           Stress Protocol Rest HR: 58 Stress HR: 166  Rest BP: 129/76 Stress BP: 146/73  Exercise Time (min): 11:30 METS: 13.4           Dose of Adenosine (mg):  n/a Dose of Lexiscan: n/a mg  Dose of Atropine (mg): n/a Dose of Dobutamine: n/a mcg/kg/min (at max HR)  Stress Test Technologist: Craig Ramos, BS-ES  Nuclear Technologist:  Craig Ramos, CNMT     Rest Procedure:  Myocardial perfusion imaging was performed at rest 45 minutes following the intravenous administration of Technetium 6118m Sestamibi. Rest ECG: SB, LVH, early repolarization  Stress Procedure:  The patient exercised on the treadmill utilizing the Bruce Protocol for 11:30  minutes. The patient stopped due to fatigue and denied any chest pain.  Technetium 4118m Sestamibi was injected at peak exercise and myocardial perfusion imaging was performed after a brief delay. Stress ECG: No significant change from baseline ECG  QPS Raw Data Images:  Normal; no motion artifact; normal heart/lung ratio. Stress Images:  Normal homogeneous uptake in all areas of the myocardium. Rest Images:  Normal homogeneous uptake in all areas of the myocardium. Subtraction (SDS):  No evidence of ischemia. Transient Ischemic Dilatation (Normal <1.22):  0.88 Lung/Heart Ratio (Normal <0.45):  0.26  Quantitative Gated Spect Images QGS EDV:  78 ml QGS ESV:  36 ml  Impression Exercise Capacity:  Excellent exercise capacity. BP Response:  Normal blood pressure response. Clinical Symptoms:  No significant symptoms noted. ECG Impression:  No significant ST segment change suggestive of ischemia. Comparison with Prior Nuclear Study: No images to compare  Overall Impression:  Normal stress nuclear study.  LV Ejection Fraction: 54%.  LV Wall Motion:  NL LV Function; NL Wall Motion  Craig Ramos, Craig Ramos 11/26/2013

## 2013-12-08 ENCOUNTER — Telehealth: Payer: Self-pay | Admitting: *Deleted

## 2013-12-08 NOTE — Telephone Encounter (Signed)
Received call from pt requesting stress test results. Please advise.

## 2013-12-08 NOTE — Telephone Encounter (Signed)
Stress test is normal

## 2013-12-09 NOTE — Telephone Encounter (Signed)
Notified pt. 

## 2014-02-23 ENCOUNTER — Ambulatory Visit (INDEPENDENT_AMBULATORY_CARE_PROVIDER_SITE_OTHER): Payer: 59 | Admitting: Medical

## 2014-02-23 ENCOUNTER — Encounter: Payer: Self-pay | Admitting: Medical

## 2014-02-23 VITALS — BP 120/80 | HR 80 | Temp 98.8°F | Ht 63.5 in | Wt 131.6 lb

## 2014-02-23 DIAGNOSIS — N342 Other urethritis: Secondary | ICD-10-CM | POA: Insufficient documentation

## 2014-02-23 DIAGNOSIS — R300 Dysuria: Secondary | ICD-10-CM | POA: Insufficient documentation

## 2014-02-23 LAB — POCT URINALYSIS DIPSTICK
BILIRUBIN UA: NEGATIVE
Blood, UA: NEGATIVE
Glucose, UA: NEGATIVE
KETONES UA: NEGATIVE
Leukocytes, UA: NEGATIVE
NITRITE UA: NEGATIVE
Protein, UA: NEGATIVE
Spec Grav, UA: 1.015
Urobilinogen, UA: 0.2
pH, UA: 6.5

## 2014-02-23 MED ORDER — DOXYCYCLINE HYCLATE 100 MG PO TABS: 100.0000 mg | ORAL_TABLET | Freq: Two times a day (BID) | ORAL | Status: DC

## 2014-02-23 NOTE — Patient Instructions (Addendum)
For your dysuria, I am prescribing doxycycline.(You may have uti or nonspecific urethritis). I am sending your urine out for culture. If your symptoms persist or worsen then we may do further testing including checking your prostate blood work. Follow up in 7 days or as needed.

## 2014-02-23 NOTE — Assessment & Plan Note (Addendum)
Will get a urine culture and see if positive for bacteria. Based on his symptoms I am leaning towards treatment of nonspecific urethritis. However, if he does not respond to doxycycline they would consider further workup such as getting a PSA. However he is less than 50 and does not have classic prostatitis type symptoms. Treatment saying please see urethritis.

## 2014-02-23 NOTE — Assessment & Plan Note (Signed)
Treat for nonspecific type today. Doxycycline 100 mg #20 one tablet by mouth twice a day x10 days.

## 2014-02-23 NOTE — Progress Notes (Signed)
   Subjective:    Patient ID: Craig Ramos, male    DOB: [birth year], 43 y.o.   MRN: [id number]  HPI  Pt in states he has some slight dysuria when he urinates.He states he had same symptoms last year. Pt states no definite discharge. He is  married and no sex with anyone other than wife. No testicle pain. He states years ago varicocele surgery. Pt  has some mild  dribbling. Some mild hesitancy. No fever, no chills. No back pain. Last year he got better with cipro quickly.   Review of Systems  Constitutional: Negative for fever, chills and fatigue.  HENT: Negative.   Respiratory: Negative for cough, chest tightness, shortness of breath and wheezing.   Cardiovascular: Negative for chest pain and palpitations.  Gastrointestinal: Negative for vomiting, abdominal pain, diarrhea, constipation, blood in stool and rectal pain.  Endocrine: Negative for polydipsia and polyphagia.  Genitourinary: Positive for dysuria. Negative for frequency, hematuria, flank pain, scrotal swelling, difficulty urinating, genital sores, penile pain and testicular pain.       Maybe dribbles a little bit at end of stream and maybe mild hesitant. What he states mostly is dysuria.       Objective:   Physical Exam  General- No acute distress, pleasant patient. Lungs- Clear, even and unlabored. Heart- RRR. Abdomen- soft, non tender, Positive, bowel sounds. No rebound or guarding.  Genital- no tesiticle tenderness, no hernia. No lesions on penis. No dc.        Assessment & Plan:

## 2014-02-24 LAB — CULTURE, URINE COMPREHENSIVE
COLONY COUNT: NO GROWTH
Organism ID, Bacteria: NO GROWTH

## 2014-02-25 ENCOUNTER — Telehealth: Payer: Self-pay | Admitting: Medical

## 2014-02-25 NOTE — Telephone Encounter (Signed)
Pt called and advised to let us know by Friday am or call him to see how he is. Early in course of treatment but by Friday should feel some improvement. If not improving then will plan to switch antibiotic.

## 2014-02-25 NOTE — Telephone Encounter (Signed)
Patient notified

## 2014-02-26 ENCOUNTER — Encounter: Payer: Self-pay | Admitting: Medical

## 2014-02-26 ENCOUNTER — Ambulatory Visit (INDEPENDENT_AMBULATORY_CARE_PROVIDER_SITE_OTHER): Payer: 59 | Admitting: Medical

## 2014-02-26 VITALS — BP 128/87 | HR 69 | Temp 97.4°F | Ht 63.5 in | Wt 129.8 lb

## 2014-02-26 DIAGNOSIS — N342 Other urethritis: Secondary | ICD-10-CM

## 2014-02-26 DIAGNOSIS — N410 Acute prostatitis: Secondary | ICD-10-CM

## 2014-02-26 LAB — PSA: PSA: 0.99 ng/mL (ref 0.10–4.00)

## 2014-02-26 MED ORDER — CIPROFLOXACIN HCL 500 MG PO TABS: 500.0000 mg | ORAL_TABLET | Freq: Two times a day (BID) | ORAL | Status: DC

## 2014-02-26 NOTE — Assessment & Plan Note (Signed)
Due to patient's concern I did go ahead and order a PSA today. This is not likely with his age but it is possible. I advised patient to continue doxycycline but if by Saturday he feels no improvement he could stop doxycycline and then switched to Cipro. PSA is pending and urine culture from the other day is also pending. Will notify patient of both results when those are in. I did advise patient that if his symptoms persist and test results are negative then we will need to consider urine ancillary studies.

## 2014-02-26 NOTE — Patient Instructions (Signed)
For your persisting pain on urination and your recent frequent urination despite use of doxycycline(Treated for nonspecific urethritis), I am ordering a psa to evaluate possible prostatitis. Prostatitis is less common in your age group but possible. If your symptoms persist by Saturday despite doxycycline, then stop doxycycline and switch to ciprofloxin. I am sending cipro to your pharmacy. Follow up in 7 days or as needed.

## 2014-02-26 NOTE — Progress Notes (Signed)
   Subjective:    Patient ID: Craig Ramos, male    DOB: [birth year], 43 y.o.   MRN: [id number]  HPI  Mild better. No much. Pt is concerned about his prostate. I treated him for nonspecific urethritis with doxycycline. No family hx of prostate problems. He mentioned 6 months ago he remembered having for only one day of mild typical urination and then got better. Pt has no dc fro penis. No testicle pain. Pt again only has sex with wife. Pt convinced wife faithful as well. 3-4 days ago some frequent urination.    Review of Systems  Constitutional: Negative for fever, chills and fatigue.  Genitourinary: Positive for dysuria, urgency and frequency. Negative for hematuria, flank pain, scrotal swelling and testicular pain.       Slight hesitant urine flow through 4 days ago.  Musculoskeletal: Negative for back pain, myalgias and neck pain.  Skin: Negative for rash.       Objective:   Physical Exam  Did not repeat exam today since he was seen only 2 days ago. He worse no change on exam either. I deferred prostate exam/manipulation as that may effect PSA value.       Assessment & Plan:

## 2014-02-26 NOTE — Assessment & Plan Note (Signed)
Patient still has dysuria despite doxycycline and he is more concerned about prostatitis today.

## 2014-02-27 ENCOUNTER — Telehealth: Payer: Self-pay | Admitting: Medical

## 2014-02-27 MED ORDER — PHENAZOPYRIDINE HCL 200 MG PO TABS: 200.0000 mg | ORAL_TABLET | Freq: Three times a day (TID) | ORAL | Status: DC | PRN

## 2014-02-27 NOTE — Telephone Encounter (Signed)
Pyridium sent to his pharmacy. Stop doxy and start cipro. If dysuria persist by Monday then get urine ancillary studies and refer to urology. Clydie Braun will notify pt.

## 2014-03-09 ENCOUNTER — Ambulatory Visit (INDEPENDENT_AMBULATORY_CARE_PROVIDER_SITE_OTHER): Payer: 59 | Admitting: Medical

## 2014-03-09 ENCOUNTER — Ambulatory Visit (HOSPITAL_BASED_OUTPATIENT_CLINIC_OR_DEPARTMENT_OTHER)
Admission: RE | Admit: 2014-03-09 | Discharge: 2014-03-09 | Disposition: A | Discharge status: Home / Self Care | Payer: 59 | Source: Ambulatory Visit | Attending: Medical | Admitting: Medical

## 2014-03-09 ENCOUNTER — Encounter: Payer: Self-pay | Admitting: Medical

## 2014-03-09 VITALS — BP 108/69 | HR 68 | Temp 98.2°F | Ht 63.5 in | Wt 130.4 lb

## 2014-03-09 DIAGNOSIS — R109 Unspecified abdominal pain: Secondary | ICD-10-CM | POA: Insufficient documentation

## 2014-03-09 DIAGNOSIS — M549 Dorsalgia, unspecified: Secondary | ICD-10-CM

## 2014-03-09 DIAGNOSIS — M545 Low back pain, unspecified: Secondary | ICD-10-CM

## 2014-03-09 HISTORY — DX: Dorsalgia, unspecified: M54.9

## 2014-03-09 NOTE — Patient Instructions (Addendum)
For your back pain over rt kidney region I ordered a ultrasound of your kidney.   Follow up as needed. We will call you on your [place] results.  If you have recurrent urinary symptoms please notify us/be seen again.

## 2014-03-09 NOTE — Assessment & Plan Note (Signed)
Pt is complaining of rt side cva pain. This is associated with some urinary symptoms at times as well. He request Ultrasound today and did place the order.

## 2014-03-09 NOTE — Progress Notes (Signed)
   Subjective:    Patient ID: Craig Ramos, male    DOB: 10-08-1970, 43 y.o.   MRN: [id number]  HPI  Pt states that his urination is now painless. But it was painful. He was having some difficult to urinate. I first wrote doxycycline then switched to cipro. Now burning is better. Some difficulty passing urine as wel in the past when has urinary symptoms. Occasional rt side back pain when he is urinating. Pain at times over rt kidney. No family history of kidney problems. Pt is requesting ultrasound of his kidney. Sometimes in past when he pressure on his stomach he get rt side cva area mild discomfort. On and off rt cva area pain for couple of years. Pain not acutely present today.    Review of Systems  Constitutional: Negative for fever, chills and fatigue.  Respiratory: Negative for cough, shortness of breath and wheezing.   Cardiovascular: Negative for chest pain and palpitations.  Gastrointestinal: Negative for abdominal pain, diarrhea, constipation and abdominal distention.       Only occasional reflux. No symptoms presently. He did recently restart nexium.  Genitourinary: Negative.        Now resolved.  Musculoskeletal: Positive for back pain.       Occasional pain over rt kidney area. Associated with urinary symptoms at times.  Skin: Negative for color change.  Neurological: Negative.   Hematological: Negative for adenopathy. Does not bruise/bleed easily.       Objective:   Physical Exam  General- No acute distress. Pleasant. Lungs- Clear, even and unlabored. Heart-RRR Abdomen- soft, nontender, nondistended, Positive bowel sounds. Back- No cva tenderness presently(but doe report some occasional). At time associated with urinary symptoms)       Assessment & Plan:

## 2015-01-18 ENCOUNTER — Ambulatory Visit (INDEPENDENT_AMBULATORY_CARE_PROVIDER_SITE_OTHER): Payer: 59 | Admitting: Family

## 2015-01-18 ENCOUNTER — Encounter: Payer: Self-pay | Admitting: Family

## 2015-01-18 VITALS — BP 110/70 | HR 54 | Temp 98.4°F | Resp 16 | Ht 63.5 in | Wt 130.6 lb

## 2015-01-18 DIAGNOSIS — E785 Hyperlipidemia, unspecified: Secondary | ICD-10-CM | POA: Diagnosis not present

## 2015-01-18 DIAGNOSIS — E781 Pure hyperglyceridemia: Secondary | ICD-10-CM | POA: Diagnosis not present

## 2015-01-18 DIAGNOSIS — J309 Allergic rhinitis, unspecified: Secondary | ICD-10-CM

## 2015-01-18 DIAGNOSIS — K219 Gastro-esophageal reflux disease without esophagitis: Secondary | ICD-10-CM | POA: Diagnosis not present

## 2015-01-18 LAB — LIPID PANEL
Cholesterol: 169 mg/dL (ref 0–200)
HDL: 47.7 mg/dL (ref >39.00)
NonHDL: 121.29
TRIGLYCERIDES: 251 mg/dL — AB (ref 0.0–149.0)
Total CHOL/HDL Ratio: 4
VLDL: 50.2 mg/dL — AB (ref 0.0–40.0)

## 2015-01-18 LAB — LDL CHOLESTEROL, DIRECT: Direct LDL: 81 mg/dL

## 2015-01-18 NOTE — Patient Instructions (Signed)
Please schedule a complete physical at the front desk. Complete lab work prior to leaving.  

## 2015-01-18 NOTE — Assessment & Plan Note (Signed)
Stable on prn nexium. 
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
## 2015-01-18 NOTE — Assessment & Plan Note (Signed)
Continue follow up flp, obtain lipid panel.

## 2015-01-18 NOTE — Progress Notes (Signed)
Pre visit review using our clinic review tool, if applicable. No additional management support is needed unless otherwise documented below in the visit note. 

## 2015-01-18 NOTE — Assessment & Plan Note (Signed)
Stable with zyrtec, continue same.  

## 2015-01-21 ENCOUNTER — Telehealth: Payer: Self-pay | Admitting: Family

## 2015-01-21 DIAGNOSIS — E781 Pure hyperglyceridemia: Secondary | ICD-10-CM

## 2015-01-21 MED ORDER — OMEGA-3-ACID ETHYL ESTERS 1 G PO CAPS: ORAL_CAPSULE | ORAL | Status: DC

## 2015-01-21 NOTE — Telephone Encounter (Signed)
Trigs are still elevated.  D/c krill oil, start lovaza (rx dose fish oil).Please work on avoiding concentrated sweets, and limiting white carbs (rice/bread/pasta/potatoes). Instead substitute whole grain versions with reasonable portions. Repeat flp in 3 months.

## 2015-01-26 NOTE — Telephone Encounter (Signed)
Pt returned call and was notified of below recommendations and voices understanding.  Lab appt scheduled for 04/28/15 at 7:30am. Future lab order entered.

## 2015-01-26 NOTE — Telephone Encounter (Signed)
Left message for pt to call back  °

## 2015-02-01 ENCOUNTER — Ambulatory Visit (HOSPITAL_BASED_OUTPATIENT_CLINIC_OR_DEPARTMENT_OTHER)
Admission: RE | Admit: 2015-02-01 | Discharge: 2015-02-01 | Disposition: A | Discharge status: Home / Self Care | Payer: 59 | Source: Ambulatory Visit | Attending: Medical | Admitting: Medical

## 2015-02-01 ENCOUNTER — Other Ambulatory Visit: Payer: Self-pay | Admitting: Medical

## 2015-02-01 ENCOUNTER — Encounter: Payer: Self-pay | Admitting: Medical

## 2015-02-01 ENCOUNTER — Ambulatory Visit (INDEPENDENT_AMBULATORY_CARE_PROVIDER_SITE_OTHER): Payer: 59 | Admitting: Medical

## 2015-02-01 VITALS — BP 139/80 | HR 70 | Temp 99.0°F | Ht 63.2 in | Wt 126.2 lb

## 2015-02-01 DIAGNOSIS — R221 Localized swelling, mass and lump, neck: Secondary | ICD-10-CM | POA: Diagnosis not present

## 2015-02-01 DIAGNOSIS — M25562 Pain in left knee: Secondary | ICD-10-CM | POA: Diagnosis not present

## 2015-02-01 DIAGNOSIS — E785 Hyperlipidemia, unspecified: Secondary | ICD-10-CM | POA: Diagnosis not present

## 2015-02-01 DIAGNOSIS — M25552 Pain in left hip: Secondary | ICD-10-CM | POA: Diagnosis not present

## 2015-02-01 DIAGNOSIS — S76312A Strain of muscle, fascia and tendon of the posterior muscle group at thigh level, left thigh, initial encounter: Secondary | ICD-10-CM | POA: Diagnosis not present

## 2015-02-01 DIAGNOSIS — R599 Enlarged lymph nodes, unspecified: Secondary | ICD-10-CM | POA: Diagnosis not present

## 2015-02-01 DIAGNOSIS — M79605 Pain in left leg: Secondary | ICD-10-CM | POA: Insufficient documentation

## 2015-02-01 DIAGNOSIS — R590 Localized enlarged lymph nodes: Secondary | ICD-10-CM

## 2015-02-01 DIAGNOSIS — K6289 Other specified diseases of anus and rectum: Secondary | ICD-10-CM

## 2015-02-01 MED ORDER — CEPHALEXIN 500 MG PO CAPS: 500.0000 mg | ORAL_CAPSULE | Freq: Two times a day (BID) | ORAL | Status: DC

## 2015-02-01 MED ORDER — HYDROCORTISONE ACETATE 25 MG RE SUPP: 25.0000 mg | Freq: Two times a day (BID) | RECTAL | Status: DC

## 2015-02-01 MED ORDER — DICLOFENAC SODIUM 75 MG PO TBEC: 75.0000 mg | DELAYED_RELEASE_TABLET | Freq: Two times a day (BID) | ORAL | Status: DC

## 2015-02-01 NOTE — Progress Notes (Signed)
Pre visit review using our clinic review tool, if applicable. No additional management support is needed unless otherwise documented below in the visit note. 

## 2015-02-01 NOTE — Patient Instructions (Addendum)
For you lt hamstring/upper leg region pain. Rx diclofenac. Will get hip and femur xray. If pain persists can refer to sports medicine(you declined today).  For your rectum pain. Will advise metamucil 1 tablespoon in 8 oz water 3 times daily. Use anusol hc supp. Do hemocult cards and turn those in.  For lymph node that feels like may be swollen. Will get soft tissue neck US. Rx cephalexin, get cbc.   With you hx of high cholesterol and you desire for labs do think reasonable to get cmp as you may need to be on lipid rx in near future.  Follow up in 2 wks or as needed
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
## 2015-02-02 LAB — CBC WITH DIFFERENTIAL/PLATELET
BASOS PCT: 0.1 % (ref 0.0–3.0)
Basophils Absolute: 0 10*3/uL (ref 0.0–0.1)
EOS ABS: 0 10*3/uL (ref 0.0–0.7)
EOS PCT: 0.1 % (ref 0.0–5.0)
HCT: 44.8 % (ref 39.0–52.0)
Hemoglobin: 15.1 g/dL (ref 13.0–17.0)
LYMPHS ABS: 2 10*3/uL (ref 0.7–4.0)
Lymphocytes Relative: 24.8 % (ref 12.0–46.0)
MCHC: 33.7 g/dL (ref 30.0–36.0)
MCV: 90.6 fl (ref 78.0–100.0)
MONO ABS: 0.5 10*3/uL (ref 0.1–1.0)
Monocytes Relative: 6.5 % (ref 3.0–12.0)
Neutro Abs: 5.6 10*3/uL (ref 1.4–7.7)
Neutrophils Relative %: 68.5 % (ref 43.0–77.0)
Platelets: 216 10*3/uL (ref 150.0–400.0)
RBC: 4.95 Mil/uL (ref 4.22–5.81)
RDW: 12.9 % (ref 11.5–15.5)
WBC: 8.2 10*3/uL (ref 4.0–10.5)

## 2015-02-02 LAB — COMPREHENSIVE METABOLIC PANEL
ALT: 17 U/L (ref 0–53)
AST: 18 U/L (ref 0–37)
Albumin: 4.8 g/dL (ref 3.5–5.2)
Alkaline Phosphatase: 66 U/L (ref 39–117)
BUN: 15 mg/dL (ref 6–23)
CHLORIDE: 103 meq/L (ref 96–112)
CO2: 30 meq/L (ref 19–32)
CREATININE: 0.98 mg/dL (ref 0.40–1.50)
Calcium: 10.1 mg/dL (ref 8.4–10.5)
GFR: 88.28 mL/min (ref >60.00)
Glucose, Bld: 86 mg/dL (ref 70–99)
POTASSIUM: 3.5 meq/L (ref 3.5–5.1)
Sodium: 140 mEq/L (ref 135–145)
Total Bilirubin: 1.1 mg/dL (ref 0.2–1.2)
Total Protein: 8 g/dL (ref 6.0–8.3)

## 2015-02-09 ENCOUNTER — Ambulatory Visit (INDEPENDENT_AMBULATORY_CARE_PROVIDER_SITE_OTHER): Payer: 59 | Admitting: Medical

## 2015-02-09 ENCOUNTER — Encounter: Payer: Self-pay | Admitting: Medical

## 2015-02-09 VITALS — BP 144/88 | HR 70 | Temp 98.0°F | Ht 63.2 in | Wt 127.0 lb

## 2015-02-09 DIAGNOSIS — R590 Localized enlarged lymph nodes: Secondary | ICD-10-CM

## 2015-02-09 DIAGNOSIS — K6289 Other specified diseases of anus and rectum: Secondary | ICD-10-CM | POA: Diagnosis not present

## 2015-02-09 DIAGNOSIS — S76312D Strain of muscle, fascia and tendon of the posterior muscle group at thigh level, left thigh, subsequent encounter: Secondary | ICD-10-CM | POA: Diagnosis not present

## 2015-02-09 DIAGNOSIS — R5383 Other fatigue: Secondary | ICD-10-CM | POA: Diagnosis not present

## 2015-02-09 DIAGNOSIS — R599 Enlarged lymph nodes, unspecified: Secondary | ICD-10-CM | POA: Diagnosis not present

## 2015-02-09 LAB — TSH: TSH: 1.51 u[IU]/mL (ref 0.35–4.50)

## 2015-02-09 NOTE — Progress Notes (Signed)
Pre visit review using our clinic review tool, if applicable. No additional management support is needed unless otherwise documented below in the visit note. 

## 2015-02-09 NOTE — Patient Instructions (Addendum)
Your hamstring is much better. If this persists for another 2 weeks let me know and could refer to sports medicine.  Your rectum pain appears to have been constipation/straining related. Improved with metamucil. But still turn in hemoccult cards.  For your persisting vague submandibular pain and negative labs will refer to ENT for second opinion.  Follow her as needed post ent referral.

## 2015-02-09 NOTE — Progress Notes (Signed)
Subjective:    Patient ID: Craig Ramos, male    DOB: 01-12-1971, 44 y.o.   MRN: [id number]  HPI  Pt in states back of his hamstring left side. This is better but still present. Xray of pelvis and femur was negative. Pt states hamstring pain is 60% less. Pain mostly just when sitting. Pain last time on and off throughout the day before.(all day). Now not the case.  Pt states his rectum  pain feels better. See that hx. This pain resolved with use of the metamucil. Pt will turn in  the stool cards  next week.  Pt advised that he has future lipid panel ordered.  Pt still has vague mild irritation to his left submandibular area and under his chin. This has been going on and off for years. Pt went to one ENT doctor that did not find anything. I did [place] which only showed some normal variant lymph nodes.   Pt went to dentist last week for clearing and no abnormality seen.  Last time I ordered cbc and cmp. He states some mild fatigue at times. Will add tsh today.   Review of Systems  Constitutional: Negative for fever, chills, diaphoresis and fatigue.  HENT: Negative.   Respiratory: Negative for chest tightness.   Cardiovascular: Negative for chest pain and palpitations.  Gastrointestinal: Positive for constipation and rectal pain. Negative for nausea, vomiting, abdominal pain, diarrhea, blood in stool, abdominal distention and anal bleeding.       Resolved both from last visit.  Musculoskeletal: Negative for back pain.       Lt leg/hamstring(mostly hamstring/hip area pain.  No this is much improved compared to last visit.  Neurological: Negative for dizziness, seizures, speech difficulty, weakness, light-headedness, numbness and headaches.  Hematological: Positive for adenopathy. Does not bruise/bleed easily.       Objective:   Physical Exam  General  Mental Status - Alert. General Appearance - Well groomed. Not in acute distress.  Skin Rashes- No Rashes.  HEENT Head-  Normal. Ear Auditory Canal - Left- Normal. Right - Normal.Tympanic Membrane- Left- Normal. Right- Normal. Eye Sclera/Conjunctiva- Left- Normal. Right- Normal. Nose & Sinuses Nasal Mucosa- Left- Not boggy or Congested. Right- Not boggy or Congested. Mouth & Throat Lips: Upper Lip- Normal: no dryness, cracking, pallor, cyanosis, or vesicular eruption. Lower Lip-Normal: no dryness, cracking, pallor, cyanosis or vesicular eruption. Buccal Mucosa- Bilateral- No Aphthous ulcers. Oropharynx- No Discharge or Erythema. Tonsils: Characteristics- Bilateral- No Erythema or Congestion. Size/Enlargement- Bilateral- No enlargement. Discharge- bilateral-None.  Neck Neck- Supple. No Masses. Lt sub mandbular node mild swollen but not tender today.   Chest and Lung Exam Auscultation: Breath Sounds:- even and unlabored, Cardiovascular Auscultation:Rythm- Regular, rate and rhythm. Murmurs & Other Heart Sounds:Ausculatation of the heart reveal- No Murmurs.  Lymphatic Head & Neck General Head & Neck Lymphatics: Bilateral: Description- No Localized lymphadenopathy except left submandibular region.  Let hip- good rom. No crepitus. But hamstring area pain on papation deep toward buttox much less today.     Abdomen Inspection:-Inspection Normal.  Palpation/Perucssion: Palpation and Percussion of the abdomen reveal- Non Tender, No Rebound tenderness, No rigidity(Guarding) and No Palpable abdominal masses.  Liver:-Normal.  Spleen:- Normal.       Assessment & Plan:  Your hamstring is much better. If this persists for  another 2 weeks let me know and could refer to sports medicine.  Your rectum pain appears to have been constipation/straining related. Improved with metamucil. But still turn in hemoccult cards.  For  your persisting vague submandibular pain and negative labs will refer to ENT for second opinion.  Follow her as needed post ent referral.

## 2015-04-28 ENCOUNTER — Other Ambulatory Visit (INDEPENDENT_AMBULATORY_CARE_PROVIDER_SITE_OTHER): Payer: 59

## 2015-04-28 ENCOUNTER — Encounter: Payer: Self-pay | Admitting: Family

## 2015-04-28 DIAGNOSIS — E781 Pure hyperglyceridemia: Secondary | ICD-10-CM

## 2015-04-28 LAB — LIPID PANEL
Cholesterol: 146 mg/dL (ref 0–200)
HDL: 45.1 mg/dL (ref >39.00)
NONHDL: 100.5
TRIGLYCERIDES: 224 mg/dL — AB (ref 0.0–149.0)
Total CHOL/HDL Ratio: 3
VLDL: 44.8 mg/dL — ABNORMAL HIGH (ref 0.0–40.0)

## 2015-04-28 LAB — LDL CHOLESTEROL, DIRECT: LDL DIRECT: 57 mg/dL

## 2015-07-30 ENCOUNTER — Encounter: Payer: Self-pay | Admitting: Medical

## 2015-07-30 ENCOUNTER — Ambulatory Visit (INDEPENDENT_AMBULATORY_CARE_PROVIDER_SITE_OTHER): Payer: BC Managed Care – PPO | Admitting: Medical

## 2015-07-30 VITALS — BP 122/62 | HR 61 | Temp 98.1°F | Ht 63.2 in | Wt 132.6 lb

## 2015-07-30 DIAGNOSIS — Z23 Encounter for immunization: Secondary | ICD-10-CM

## 2015-07-30 DIAGNOSIS — R1013 Epigastric pain: Secondary | ICD-10-CM | POA: Diagnosis not present

## 2015-07-30 DIAGNOSIS — Z293 Encounter for prophylactic fluoride administration: Secondary | ICD-10-CM

## 2015-07-30 LAB — CBC WITH DIFFERENTIAL/PLATELET
BASOS ABS: 0.1 10*3/uL (ref 0.0–0.1)
BASOS PCT: 1 % (ref 0.0–3.0)
EOS ABS: 0.2 10*3/uL (ref 0.0–0.7)
Eosinophils Relative: 2.2 % (ref 0.0–5.0)
HCT: 44.3 % (ref 39.0–52.0)
Hemoglobin: 15 g/dL (ref 13.0–17.0)
LYMPHS ABS: 3.3 10*3/uL (ref 0.7–4.0)
Lymphocytes Relative: 42.1 % (ref 12.0–46.0)
MCHC: 33.8 g/dL (ref 30.0–36.0)
MCV: 88.5 fl (ref 78.0–100.0)
Monocytes Absolute: 0.6 10*3/uL (ref 0.1–1.0)
Monocytes Relative: 7.5 % (ref 3.0–12.0)
NEUTROS ABS: 3.7 10*3/uL (ref 1.4–7.7)
NEUTROS PCT: 47.2 % (ref 43.0–77.0)
PLATELETS: 231 10*3/uL (ref 150.0–400.0)
RBC: 5 Mil/uL (ref 4.22–5.81)
RDW: 13.2 % (ref 11.5–15.5)
WBC: 7.9 10*3/uL (ref 4.0–10.5)

## 2015-07-30 LAB — COMPREHENSIVE METABOLIC PANEL
ALT: 28 U/L (ref 0–53)
AST: 20 U/L (ref 0–37)
Albumin: 4.4 g/dL (ref 3.5–5.2)
Alkaline Phosphatase: 85 U/L (ref 39–117)
BILIRUBIN TOTAL: 1 mg/dL (ref 0.2–1.2)
BUN: 14 mg/dL (ref 6–23)
CO2: 31 meq/L (ref 19–32)
CREATININE: 1.08 mg/dL (ref 0.40–1.50)
Calcium: 9.9 mg/dL (ref 8.4–10.5)
Chloride: 102 mEq/L (ref 96–112)
GFR: 78.74 mL/min (ref >60.00)
GLUCOSE: 103 mg/dL — AB (ref 70–99)
Potassium: 3.8 mEq/L (ref 3.5–5.1)
SODIUM: 139 meq/L (ref 135–145)
Total Protein: 7.5 g/dL (ref 6.0–8.3)

## 2015-07-30 LAB — AMYLASE: Amylase: 53 U/L (ref 27–131)

## 2015-07-30 LAB — LIPASE: LIPASE: 30 U/L (ref 11.0–59.0)

## 2015-07-30 MED ORDER — RANITIDINE HCL 150 MG PO CAPS: 150.0000 mg | ORAL_CAPSULE | Freq: Two times a day (BID) | ORAL | Status: DC

## 2015-07-30 MED FILL — raNITIdine HCL 150 MG TABS: 150 | 30 days supply | Qty: 60 | Fill #0
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
## 2015-07-30 NOTE — Patient Instructions (Signed)
For your abdomen pain can take 2 otc nexium tabs a day. If epigastric discomfort persists then add on ranitidine.   Strict healthy diet and no alcohol.  Will get labs to include pancrease enzymes and stomach bacteria test to determine potential cause.  If labs negative but pain persists despite above treatment then consider Gi evaluation.  Follow up in 7 days or as needed.

## 2015-07-30 NOTE — Progress Notes (Signed)
Pre visit review using our clinic review tool, if applicable. No additional management support is needed unless otherwise documented below in the visit note. 

## 2015-08-02 LAB — H. PYLORI BREATH TEST: H. PYLORI BREATH TEST: NOT DETECTED

## 2015-08-03 ENCOUNTER — Telehealth: Payer: Self-pay | Admitting: *Deleted

## 2015-08-03 NOTE — Telephone Encounter (Signed)
PA for Lovaza initiated via CMM, key: [id number]/[id number] 02/21

## 2015-08-04 NOTE — Telephone Encounter (Signed)
Received [employer] form from [facility] on PA for Lovaza. Completed nd faxed; awaiting decision/SLS 02/22

## 2015-08-04 NOTE — Telephone Encounter (Signed)
Received Denial on Lovaza via Insurance stating "currnet plan approved criteria does not allow the coverage of Lovaza if the patient does not or did not have a triglyceride level greater than or equal to 500 mg/dL prior to the start of a triglyceride lowering drug" [no alternatives given]/SLS 02/22 Please Advise.

## 2015-08-05 NOTE — Telephone Encounter (Signed)
Noted. D/C lovaza, d/c krill oil. Start fish oil  two caps po bid.

## 2015-08-10 NOTE — Telephone Encounter (Signed)
Patient informed, understood & agreed/SLS 02/28

## 2015-08-27 ENCOUNTER — Ambulatory Visit (INDEPENDENT_AMBULATORY_CARE_PROVIDER_SITE_OTHER): Payer: BC Managed Care – PPO | Admitting: Medical

## 2015-08-27 ENCOUNTER — Encounter: Payer: Self-pay | Admitting: Medical

## 2015-08-27 VITALS — BP 120/68 | HR 99 | Temp 99.5°F | Ht 63.0 in | Wt 132.2 lb

## 2015-08-27 DIAGNOSIS — R6883 Chills (without fever): Secondary | ICD-10-CM

## 2015-08-27 DIAGNOSIS — R591 Generalized enlarged lymph nodes: Secondary | ICD-10-CM | POA: Diagnosis not present

## 2015-08-27 LAB — CBC WITH DIFFERENTIAL/PLATELET
BASOS PCT: 0.6 % (ref 0.0–3.0)
Basophils Absolute: 0.1 10*3/uL (ref 0.0–0.1)
EOS ABS: 0 10*3/uL (ref 0.0–0.7)
EOS PCT: 0.2 % (ref 0.0–5.0)
HCT: 45.1 % (ref 39.0–52.0)
HEMOGLOBIN: 15.4 g/dL (ref 13.0–17.0)
LYMPHS ABS: 1.4 10*3/uL (ref 0.7–4.0)
Lymphocytes Relative: 13.5 % (ref 12.0–46.0)
MCHC: 34 g/dL (ref 30.0–36.0)
MCV: 87.8 fl (ref 78.0–100.0)
MONO ABS: 1.4 10*3/uL — AB (ref 0.1–1.0)
Monocytes Relative: 13.6 % — ABNORMAL HIGH (ref 3.0–12.0)
NEUTROS ABS: 7.6 10*3/uL (ref 1.4–7.7)
NEUTROS PCT: 72.1 % (ref 43.0–77.0)
PLATELETS: 249 10*3/uL (ref 150.0–400.0)
RBC: 5.14 Mil/uL (ref 4.22–5.81)
RDW: 12.5 % (ref 11.5–15.5)
WBC: 10.5 10*3/uL (ref 4.0–10.5)

## 2015-08-27 MED ORDER — AMOXICILLIN-POT CLAVULANATE 875-125 MG PO TABS: 1.0000 | ORAL_TABLET | Freq: Two times a day (BID) | ORAL | Status: DC

## 2015-08-27 MED ORDER — DICLOFENAC SODIUM 75 MG PO TBEC: 75.0000 mg | DELAYED_RELEASE_TABLET | Freq: Two times a day (BID) | ORAL | Status: DC

## 2015-08-27 MED ORDER — CEFTRIAXONE SODIUM 1 G IJ SOLR
1.0000 g | Freq: Once | INTRAMUSCULAR | Status: AC
  Administered 2015-08-27: 1 g via INTRAMUSCULAR

## 2015-08-27 MED FILL — AMOX-CLAV 875-125 MG TABLET: 875-125 | 10 days supply | Qty: 20 | Fill #0

## 2015-08-27 MED FILL — DICLOFENAC SOD EC 75 MG TAB: 75 | 10 days supply | Qty: 20 | Fill #0

## 2015-08-27 NOTE — Progress Notes (Signed)
Pre visit review using our clinic review tool, if applicable. No additional management support is needed unless otherwise documented below in the visit note. 

## 2015-08-27 NOTE — Addendum Note (Signed)
Addended by: Neldon LabellaMABE, HOLDEN S on: 08/27/2015 03:36 PM   Modules accepted: Orders

## 2015-08-27 NOTE — Patient Instructions (Addendum)
For your enlarged lymph node, pain, chills, and inflammation, we gave you rocephin 1 gram im, augmentin antibiotic and diclofenac(for pain and inflammation).  Get cbc today.(currently trying to determine source of infection. But none found. It is possible you may have teeth source)  If this area worsens over weekend then ED evaluation.  I want you to schedule for Monday recheck. I may need to repeat Ultrasound(as you had done before) or get other imaging studies.
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
## 2015-08-30 ENCOUNTER — Encounter: Payer: Self-pay | Admitting: Medical

## 2015-08-30 ENCOUNTER — Ambulatory Visit (INDEPENDENT_AMBULATORY_CARE_PROVIDER_SITE_OTHER): Payer: BC Managed Care – PPO | Admitting: Medical

## 2015-08-30 VITALS — BP 110/74 | HR 60 | Temp 98.0°F | Ht 63.0 in | Wt 134.6 lb

## 2015-08-30 DIAGNOSIS — R591 Generalized enlarged lymph nodes: Secondary | ICD-10-CM

## 2015-08-30 NOTE — Progress Notes (Signed)
   Subjective:    Patient ID: Craig Ramos, male    DOB: Nov 08, 1970, 45 y.o.   MRN: [id number]  HPI   Pt in for follow up. He does have decreased size of left submandibular lymph node. Pt states mouth feels dry at times a lot . Pt saw Dr. Annalee GentaShoemaker in the past. But in past when seen was told negative work up. Some soreness at base of his tongue left side. I saw him last Friday. Wanted to follow up today to see if got better quickly.     Review of Systems  Constitutional: Negative for fever, chills and fatigue.  Respiratory: Negative for cough, choking, chest tightness, shortness of breath and wheezing.   Cardiovascular: Negative for chest pain and palpitations.  Gastrointestinal: Negative for abdominal pain and anal bleeding.  Musculoskeletal: Negative for back pain.  Skin: Negative for rash.  Neurological: Negative for dizziness and headaches.  Hematological: Positive for adenopathy. Does not bruise/bleed easily.  Psychiatric/Behavioral: Negative for behavioral problems and confusion.       Objective:   Physical Exam  Mental Status - Alert. General Appearance - Well groomed. Not in acute distress.  Skin Rashes- No Rashes.  HEENT Head- Normal. Ear Auditory Canal - Left- Normal. Right - Normal.Tympanic Membrane- Left- Normal. Right- Normal. Eye Sclera/Conjunctiva- Left- Normal. Right- Normal. Nose & Sinuses Nasal Mucosa- Left- Boggy and Congested. Right- Boggy and Congested.Bilateral maxillary and frontal sinus pressure. Mouth & Throat(over left mandible no tenderness and no parotid swelling) Lips: Upper Lip- Normal: no dryness, cracking, pallor, cyanosis, or vesicular eruption. Lower Lip-Normal: no dryness, cracking, pallor, cyanosis or vesicular eruption. Buccal Mucosa- Bilateral- No Aphthous ulcers. Oropharynx- No Discharge or Erythema. No mass or stone seen or felt on left buccal mucosa. He does apear to have some poor dentition on left lower jaw area. But no teeth  tender on palpation. At base of tongue left side moderate tenderness to palpation. Tissue feels swollen. Rt side not swollen or tender. Tonsils: Characteristics- Bilateral- No Erythema or Congestion. Size/Enlargement- Bilateral- No enlargement. Discharge- bilateral-None. No lesion on floor of mouth. No swelling of floor of mouth.   Neck Neck- Supple. No Masses.   Chest and Lung Exam Auscultation: Breath Sounds:-Clear even and unlabored.  Cardiovascular Auscultation:Rythm- Regular, rate and rhythm. Murmurs & Other Heart Sounds:Ausculatation of the heart reveal- No Murmurs.  Lymphatic Head & Neck General Head & Neck Lymphatics: Bilateral: Description- left sumbandibular node moderate swelling about 50% small today compared to Friday.. No redness or warmth.       Assessment & Plan:  Your lymph node is still swollen. Based on history of this and  reoccurring recently. I am going to try to get you in with ENT again. Come concern for salivary duct stone. I made referral and Mandy from Dr. Annalee GentaShoemaker may be calling you. They will call our office as well. While we wait continue the antibiotic. Follow up as needed

## 2015-08-30 NOTE — Progress Notes (Signed)
Pre visit review using our clinic review tool, if applicable. No additional management support is needed unless otherwise documented below in the visit note. 

## 2015-08-30 NOTE — Patient Instructions (Signed)
Your lymph node is still swollen. Based on history of this and  reoccurring recently. I am going to try to get you in with ENT again. Come concern for salivary duct stone. I made referral and Mandy from Dr. Annalee GentaShoemaker may be calling you. They will call our office as well. While we wait continue the antibiotic. Follow up as needed

## 2015-08-30 NOTE — Progress Notes (Signed)
Quick Note:  Pt has seen results on MyChart and message also sent for patient to call back if any questions. ______ 

## 2015-09-06 DIAGNOSIS — R600 Localized edema: Secondary | ICD-10-CM

## 2015-09-06 DIAGNOSIS — R609 Edema, unspecified: Secondary | ICD-10-CM

## 2015-09-06 DIAGNOSIS — K115 Sialolithiasis: Secondary | ICD-10-CM | POA: Insufficient documentation

## 2015-09-06 HISTORY — DX: Localized edema: R60.0

## 2015-09-06 HISTORY — DX: Edema, unspecified: R60.9

## 2015-09-06 HISTORY — DX: Sialolithiasis: K11.5

## 2015-11-01 ENCOUNTER — Ambulatory Visit (INDEPENDENT_AMBULATORY_CARE_PROVIDER_SITE_OTHER): Payer: BC Managed Care – PPO | Admitting: Medical

## 2015-11-01 ENCOUNTER — Encounter: Payer: Self-pay | Admitting: Medical

## 2015-11-01 VITALS — BP 100/70 | HR 68 | Temp 98.3°F | Ht 63.0 in | Wt 133.0 lb

## 2015-11-01 DIAGNOSIS — R109 Unspecified abdominal pain: Secondary | ICD-10-CM

## 2015-11-01 DIAGNOSIS — K5900 Constipation, unspecified: Secondary | ICD-10-CM | POA: Diagnosis not present

## 2015-11-01 DIAGNOSIS — Z800 Family history of malignant neoplasm of digestive organs: Secondary | ICD-10-CM | POA: Diagnosis not present

## 2015-11-01 NOTE — Patient Instructions (Addendum)
For your rare low level pain that is transient with constipation, we will get abdomen x-rays. Labs done this year were normal. I don't think repeating necessary now.  For constipation that is occasional increase fiber in diet. If needed can use miralax.  We will get ifob today and will send referral to GI.   Follow up in 3 weeks or as needed

## 2015-11-01 NOTE — Progress Notes (Signed)
Pre visit review using our clinic review tool, if applicable. No additional management support is needed unless otherwise documented below in the visit note. 
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
## 2015-11-02 ENCOUNTER — Encounter: Payer: Self-pay | Admitting: Internal Medicine

## 2015-11-15 ENCOUNTER — Other Ambulatory Visit (INDEPENDENT_AMBULATORY_CARE_PROVIDER_SITE_OTHER): Payer: BC Managed Care – PPO

## 2015-11-15 DIAGNOSIS — R109 Unspecified abdominal pain: Secondary | ICD-10-CM

## 2015-11-15 DIAGNOSIS — Z800 Family history of malignant neoplasm of digestive organs: Secondary | ICD-10-CM

## 2015-11-15 DIAGNOSIS — K5900 Constipation, unspecified: Secondary | ICD-10-CM

## 2015-11-15 LAB — FECAL OCCULT BLOOD, IMMUNOCHEMICAL: Fecal Occult Bld: NEGATIVE

## 2015-11-15 NOTE — Addendum Note (Signed)
Addended by: Eustace QuailEABOLD, Sylver Vantassell J on: 11/15/2015 09:47 AM   Modules accepted: Orders

## 2015-12-31 ENCOUNTER — Ambulatory Visit: Payer: BC Managed Care – PPO | Admitting: Internal Medicine

## 2016-01-13 ENCOUNTER — Ambulatory Visit (HOSPITAL_COMMUNITY)
Admission: EM | Admit: 2016-01-13 | Discharge: 2016-01-13 | Disposition: A | Discharge status: Home / Self Care | Payer: BC Managed Care – PPO | Attending: Emergency Medicine | Admitting: Emergency Medicine

## 2016-01-13 ENCOUNTER — Encounter (HOSPITAL_COMMUNITY): Payer: Self-pay | Admitting: Emergency Medicine

## 2016-01-13 DIAGNOSIS — M546 Pain in thoracic spine: Secondary | ICD-10-CM | POA: Diagnosis not present
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
## 2016-02-10 ENCOUNTER — Ambulatory Visit (INDEPENDENT_AMBULATORY_CARE_PROVIDER_SITE_OTHER): Payer: BC Managed Care – PPO | Admitting: Internal Medicine

## 2016-02-10 ENCOUNTER — Encounter: Payer: Self-pay | Admitting: Internal Medicine

## 2016-02-10 VITALS — BP 124/70 | HR 70 | Ht 65.0 in | Wt 132.0 lb

## 2016-02-10 DIAGNOSIS — K625 Hemorrhage of anus and rectum: Secondary | ICD-10-CM

## 2016-02-10 DIAGNOSIS — Z800 Family history of malignant neoplasm of digestive organs: Secondary | ICD-10-CM

## 2016-02-10 DIAGNOSIS — R1013 Epigastric pain: Secondary | ICD-10-CM

## 2016-02-10 MED ORDER — RANITIDINE HCL 300 MG PO TABS: 300.0000 mg | ORAL_TABLET | Freq: Every day | ORAL | 3 refills | Status: DC | PRN

## 2016-02-10 MED FILL — raNITIdine HCL 300 MG TABS: 300 | 90 days supply | Qty: 90 | Fill #0
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
## 2016-02-10 NOTE — Patient Instructions (Signed)
  You have been scheduled for a colonoscopy. Please follow written instructions given to you at your visit today.  Please pick up your prep supplies at the pharmacy. If you use inhalers (even only as needed), please bring them with you on the day of your procedure. Your physician has requested that you go to www.startemmi.com and enter the access code given to you at your visit today. This web site gives a general overview about your procedure. However, you should still follow specific instructions given to you by our office regarding your preparation for the procedure.     We have sent the following medications to your pharmacy for you to pick up at your convenience: Generic zantac     I appreciate the opportunity to care for you. Stan Headarl Gessner, MD, [facility]

## 2016-02-13 ENCOUNTER — Encounter: Payer: Self-pay | Admitting: Internal Medicine

## 2016-02-24 ENCOUNTER — Ambulatory Visit (INDEPENDENT_AMBULATORY_CARE_PROVIDER_SITE_OTHER): Payer: BC Managed Care – PPO | Admitting: Medical

## 2016-02-24 VITALS — BP 120/79 | HR 78 | Temp 98.7°F | Ht 65.0 in | Wt 133.0 lb

## 2016-02-24 DIAGNOSIS — R1013 Epigastric pain: Secondary | ICD-10-CM

## 2016-02-24 DIAGNOSIS — K219 Gastro-esophageal reflux disease without esophagitis: Secondary | ICD-10-CM

## 2016-02-24 DIAGNOSIS — I517 Cardiomegaly: Secondary | ICD-10-CM

## 2016-02-24 DIAGNOSIS — R002 Palpitations: Secondary | ICD-10-CM
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
## 2016-02-24 NOTE — Patient Instructions (Addendum)
For your abdomen pain. Continue your omeprazole and zantac that GI MD recommended. We gave you GI cocktail today. Give me update in a week how your feel. Sooner if needed. I coud relay message to your GI.  Any severe change in symptoms then ED evaluation.  Note since recent GI visit and relatively benign exam decided not to labs today.  For palpitation we did ekg today. Last event 3 days ago. I want to know if these are reoccurring.ekg did show sinus rhythm but appears to show lvh. Which last one did not show.  Follow up in 2-3 weeks or as needed
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
## 2016-03-08 ENCOUNTER — Ambulatory Visit (INDEPENDENT_AMBULATORY_CARE_PROVIDER_SITE_OTHER): Payer: BC Managed Care – PPO | Admitting: Cardiology

## 2016-03-08 ENCOUNTER — Encounter: Payer: Self-pay | Admitting: Cardiology

## 2016-03-08 VITALS — BP 100/80 | HR 72 | Ht 65.0 in | Wt 131.0 lb

## 2016-03-08 DIAGNOSIS — R9431 Abnormal electrocardiogram [ECG] [EKG]: Secondary | ICD-10-CM

## 2016-03-08 DIAGNOSIS — R002 Palpitations: Secondary | ICD-10-CM

## 2016-03-08 NOTE — Patient Instructions (Signed)
Medication Instructions:   NO CHANGE  Testing/Procedures:  Your physician has requested that you have an echocardiogram. Echocardiography is a painless test that uses sound waves to create images of your heart. It provides your doctor with information about the size and shape of your heart and how well your heart's chambers and valves are working. This procedure takes approximately one hour. There are no restrictions for this procedure.    Follow-Up:  Your physician recommends that you schedule a follow-up appointment in: AS NEEDED PENDING TEST RESULTS      

## 2016-03-17 ENCOUNTER — Encounter: Payer: Self-pay | Admitting: Internal Medicine

## 2016-03-22 ENCOUNTER — Ambulatory Visit (HOSPITAL_BASED_OUTPATIENT_CLINIC_OR_DEPARTMENT_OTHER)
Admission: RE | Admit: 2016-03-22 | Discharge: 2016-03-22 | Disposition: A | Discharge status: Home / Self Care | Payer: BC Managed Care – PPO | Source: Ambulatory Visit | Attending: Cardiology | Admitting: Cardiology

## 2016-03-22 DIAGNOSIS — R002 Palpitations: Secondary | ICD-10-CM | POA: Diagnosis not present

## 2016-03-22 DIAGNOSIS — I371 Nonrheumatic pulmonary valve insufficiency: Secondary | ICD-10-CM | POA: Insufficient documentation

## 2016-03-22 DIAGNOSIS — I071 Rheumatic tricuspid insufficiency: Secondary | ICD-10-CM | POA: Insufficient documentation

## 2016-03-22 LAB — ECHOCARDIOGRAM COMPLETE
CHL CUP DOP CALC LVOT VTI: 23 cm
CHL CUP MV DEC (S): 239
CHL CUP REG VEL DIAS: 115 cm/s
CHL CUP RV SYS PRESS: 30 mmHg
CHL CUP STROKE VOLUME: 47 mL
CHL CUP TV REG PEAK VELOCITY: 259 cm/s
E decel time: 239 msec
E/e' ratio: 4.91
FS: 38 % (ref 28–44)
IV/PV OW: 1
LA ID, A-P, ES: 33 mm
LA diam end sys: 33 mm
LA diam index: 2 cm/m2
LA vol A4C: 46.6 ml
LAVOL: 45.2 mL
LAVOLIN: 27.4 mL/m2
LDCA: 3.14 cm2
LV E/e'average: 4.91
LV sys vol index: 15 mL/m2
LV sys vol: 24 mL (ref 21–61)
LVDIAVOL: 71 mL (ref 62–150)
LVDIAVOLIN: 43 mL/m2
LVEEMED: 4.91
LVELAT: 17 cm/s
LVOT diameter: 20 mm
LVOT peak vel: 109 cm/s
LVOTSV: 72 mL
Lateral S' vel: 14.4 cm/s
MV pk A vel: 45.1 m/s
MV pk E vel: 83.4 m/s
MVPG: 3 mmHg
PV Reg grad dias: 5 mmHg
PW: 7.75 mm — AB (ref 0.6–1.1)
Simpson's disk: 66
TDI e' lateral: 17
TDI e' medial: 11.5
TR max vel: 259 cm/s

## 2016-03-22 NOTE — Progress Notes (Signed)
  Echocardiogram 2D Echocardiogram has been performed.  Nolon RodBrown, Tony 03/22/2016, 11:43 AM

## 2016-03-30 ENCOUNTER — Encounter: Payer: Self-pay | Admitting: Internal Medicine

## 2016-03-30 ENCOUNTER — Ambulatory Visit (AMBULATORY_SURGERY_CENTER): Payer: BC Managed Care – PPO | Admitting: Internal Medicine

## 2016-03-30 VITALS — BP 130/90 | HR 77 | Temp 98.9°F | Resp 17 | Ht 65.0 in | Wt 133.0 lb

## 2016-03-30 DIAGNOSIS — K648 Other hemorrhoids: Secondary | ICD-10-CM | POA: Diagnosis not present

## 2016-03-30 DIAGNOSIS — Z1211 Encounter for screening for malignant neoplasm of colon: Secondary | ICD-10-CM | POA: Diagnosis not present

## 2016-03-30 DIAGNOSIS — Z800 Family history of malignant neoplasm of digestive organs: Secondary | ICD-10-CM

## 2016-03-30 DIAGNOSIS — Z1212 Encounter for screening for malignant neoplasm of rectum: Secondary | ICD-10-CM | POA: Diagnosis not present

## 2016-03-30 MED ORDER — SODIUM CHLORIDE 0.9 % IV SOLN: 500.0000 mL | INTRAVENOUS | Status: DC

## 2016-03-30 NOTE — Progress Notes (Signed)
Report to PACU, RN, vss, BBS= Clear.  

## 2016-03-30 NOTE — Patient Instructions (Addendum)
I saw hemorrhoids which have caused the bleeding. No other abnormalities - no polyps, no cancer.  Next routine colonoscopy in 10 years - 2027   I appreciate the opportunity to care for you. Iva Booparl E. Gessner, MD, FACG   YOU HAD AN ENDOSCOPIC PROCEDURE TODAY AT THE [facility]:   Refer to the procedure report that was given to you for any specific questions about what was found during the examination.  If the procedure report does not answer your questions, please call your gastroenterologist to clarify.  If you requested that your care partner not be given the details of your procedure findings, then the procedure report has been included in a sealed envelope for you to review at your convenience later.  YOU SHOULD EXPECT: Some feelings of bloating in the abdomen. Passage of more gas than usual.  Walking can help get rid of the air that was put into your GI tract during the procedure and reduce the bloating. If you had a lower endoscopy (such as a colonoscopy or flexible sigmoidoscopy) you may notice spotting of blood in your stool or on the toilet paper. If you underwent a bowel prep for your procedure, you may not have a normal bowel movement for a few days.  Please Note:  You might notice some irritation and congestion in your nose or some drainage.  This is from the oxygen used during your procedure.  There is no need for concern and it should clear up in a day or so.  SYMPTOMS TO REPORT IMMEDIATELY:   Following lower endoscopy (colonoscopy or flexible sigmoidoscopy):  Excessive amounts of blood in the stool  Significant tenderness or worsening of abdominal pains  Swelling of the abdomen that is new, acute  Fever of 100F or higher   For urgent or emergent issues, a gastroenterologist can be reached at any hour by calling [phone].   DIET:  We do recommend a small meal at first, but then you may proceed to your regular diet.  Drink plenty of fluids but you  should avoid alcoholic beverages for 24 hours.  ACTIVITY:  You should plan to take it easy for the rest of today and you should NOT DRIVE or use heavy machinery until tomorrow (because of the sedation medicines used during the test).    FOLLOW UP: Our staff will call the number listed on your records the next business day following your procedure to check on you and address any questions or concerns that you may have regarding the information given to you following your procedure. If we do not reach you, we will leave a message.  However, if you are feeling well and you are not experiencing any problems, there is no need to return our call.  We will assume that you have returned to your regular daily activities without incident.  If any biopsies were taken you will be contacted by phone or by letter within the next 1-3 weeks.  Please call us at [phone] if you have not heard about the biopsies in 3 weeks.    SIGNATURES/CONFIDENTIALITY: You and/or your care partner have signed paperwork which will be entered into your electronic medical record.  These signatures attest to the fact that that the information above on your After Visit Summary has been reviewed and is understood.  Full responsibility of the confidentiality of this discharge information lies with you and/or your care-partner.  Read all of the handouts given to you by your recovery room  room nurse.  Thank-you for choosing us for your healthcare needs today. 

## 2016-03-30 NOTE — Op Note (Signed)
[facility] Patient Name: Craig Ramos Procedure Date: 03/30/2016 8:19 AM MRN: [id number] Endoscopist: Iva Boop , MD Age: 45 Referring MD:  Date of Birth: 06/19/1970 Gender: Male Account #: [id number] Procedure:                Colonoscopy Indications:              Screening in patient at increased risk: Colorectal                            cancer in father 50 or older Medicines:                Propofol per Anesthesia, Monitored Anesthesia Care Procedure:                Pre-Anesthesia Assessment:                           - Prior to the procedure, a History and Physical                            was performed, and patient medications and                            allergies were reviewed. The patient's tolerance of                            previous anesthesia was also reviewed. The risks                            and benefits of the procedure and the sedation                            options and risks were discussed with the patient.                            All questions were answered, and informed consent                            was obtained. Prior Anticoagulants: The patient has                            taken no previous anticoagulant or antiplatelet                            agents. ASA Grade Assessment: II - A patient with                            mild systemic disease. After reviewing the risks                            and benefits, the patient was deemed in                            satisfactory condition to undergo the procedure.  After obtaining informed consent, the colonoscope                            was passed under direct vision. Throughout the                            procedure, the patient's blood pressure, pulse, and                            oxygen saturations were monitored continuously. The                            Model CF-HQ190L [id number]) scope was introduced                            through  the anus and advanced to the the cecum,                            identified by appendiceal orifice and ileocecal                            valve. The colonoscopy was performed without                            difficulty. The patient tolerated the procedure                            well. The quality of the bowel preparation was                            excellent. The bowel preparation used was Miralax.                            The ileocecal valve, appendiceal orifice, and                            rectum were photographed. Scope In: 8:20:18 AM Scope Out: 8:31:21 AM Scope Withdrawal Time: 0 hours 8 minutes 1 second  Total Procedure Duration: 0 hours 11 minutes 3 seconds  Findings:                 The perianal and digital rectal examinations were                            normal. Pertinent negatives include normal prostate                            (size, shape, and consistency).                           Internal hemorrhoids were found. The hemorrhoids                            were small.  The exam was otherwise without abnormality on                            direct and retroflexion views. Complications:            No immediate complications. Estimated Blood Loss:     Estimated blood loss: none. Impression:               - Internal hemorrhoids.                           - The examination was otherwise normal on direct                            and retroflexion views.                           - No specimens collected. Recommendation:           - Patient has a contact number available for                            emergencies. The signs and symptoms of potential                            delayed complications were discussed with the                            patient. Return to normal activities tomorrow.                            Written discharge instructions were provided to the                            patient.                            - Resume previous diet.                           - Continue present medications.                           - Repeat colonoscopy in 10 years for screening                            purposes.                           -                           ELDERLY FATHER HAS/HAD COLON CANER - NEXT ROUTINE                            COLONOSCOPY 10 YEARS GIVEN AGE AT DX Iva Booparl E Gessner, MD 03/30/2016 8:39:45 AM This report has been signed electronically.

## 2016-03-31 ENCOUNTER — Telehealth: Payer: Self-pay

## 2016-03-31 NOTE — Telephone Encounter (Signed)
  Follow up Call-  Call back number 03/30/2016  Post procedure Call Back phone  # [phone]  Permission to leave phone message Yes  Some recent data might be hidden     Patient questions:  Do you have a fever, pain , or abdominal swelling? No. Pain Score  0 *  Have you tolerated food without any problems? Yes.     Have you been able to return to your normal activities? Yes.    Do you have any questions about your discharge instructions: Diet   No. Medications  No. Follow up visit  No.  Do you have questions or concerns about your Care? No.  Actions: * If pain score is 4 or above: No action needed, pain <4.

## 2017-02-19 ENCOUNTER — Telehealth: Payer: Self-pay | Admitting: Family

## 2017-02-19 ENCOUNTER — Telehealth: Payer: Self-pay | Admitting: Medical

## 2017-02-19 NOTE — Telephone Encounter (Signed)
Pt would like to [facility] providers [facility].   Is this switch okay?

## 2017-02-19 NOTE — Telephone Encounter (Signed)
I am ok if he switches to me provided Craig Ramos approves of the switch.

## 2017-02-19 NOTE — Telephone Encounter (Signed)
Ok with me 

## 2017-02-20 ENCOUNTER — Encounter: Payer: Self-pay | Admitting: Medical

## 2017-02-20 ENCOUNTER — Telehealth: Payer: Self-pay | Admitting: Medical

## 2017-02-20 ENCOUNTER — Ambulatory Visit (INDEPENDENT_AMBULATORY_CARE_PROVIDER_SITE_OTHER): Payer: BC Managed Care – PPO | Admitting: Medical

## 2017-02-20 VITALS — BP 115/85 | HR 65 | Temp 98.0°F | Resp 16 | Ht 65.0 in | Wt 138.6 lb

## 2017-02-20 DIAGNOSIS — R1013 Epigastric pain: Secondary | ICD-10-CM

## 2017-02-20 DIAGNOSIS — R748 Abnormal levels of other serum enzymes: Secondary | ICD-10-CM

## 2017-02-20 DIAGNOSIS — M545 Low back pain, unspecified: Secondary | ICD-10-CM

## 2017-02-20 DIAGNOSIS — R109 Unspecified abdominal pain: Secondary | ICD-10-CM | POA: Diagnosis not present

## 2017-02-20 LAB — CBC
HCT: 45.9 % (ref 39.0–52.0)
Hemoglobin: 15.4 g/dL (ref 13.0–17.0)
MCHC: 33.6 g/dL (ref 30.0–36.0)
MCV: 91.1 fl (ref 78.0–100.0)
Platelets: 215 10*3/uL (ref 150.0–400.0)
RBC: 5.04 Mil/uL (ref 4.22–5.81)
RDW: 12.6 % (ref 11.5–15.5)
WBC: 6.1 10*3/uL (ref 4.0–10.5)

## 2017-02-20 LAB — COMPREHENSIVE METABOLIC PANEL
ALBUMIN: 4.5 g/dL (ref 3.5–5.2)
ALT: 31 U/L (ref 0–53)
AST: 23 U/L (ref 0–37)
Alkaline Phosphatase: 79 U/L (ref 39–117)
BUN: 12 mg/dL (ref 6–23)
CHLORIDE: 100 meq/L (ref 96–112)
CO2: 31 mEq/L (ref 19–32)
CREATININE: 1.12 mg/dL (ref 0.40–1.50)
Calcium: 10.5 mg/dL (ref 8.4–10.5)
GFR: 74.98 mL/min (ref >60.00)
GLUCOSE: 105 mg/dL — AB (ref 70–99)
Potassium: 3.9 mEq/L (ref 3.5–5.1)
SODIUM: 139 meq/L (ref 135–145)
Total Bilirubin: 1 mg/dL (ref 0.2–1.2)
Total Protein: 7.8 g/dL (ref 6.0–8.3)

## 2017-02-20 LAB — LIPASE: Lipase: 132 U/L — ABNORMAL HIGH (ref 11.0–59.0)

## 2017-02-20 LAB — POC URINALSYSI DIPSTICK (AUTOMATED)
Bilirubin, UA: NEGATIVE
Glucose, UA: NEGATIVE
Ketones, UA: NEGATIVE
LEUKOCYTES UA: NEGATIVE
NITRITE UA: NEGATIVE
PH UA: 6 (ref 5.0–8.0)
PROTEIN UA: NEGATIVE
Spec Grav, UA: 1.015 (ref 1.010–1.025)
UROBILINOGEN UA: NEGATIVE U/dL — AB

## 2017-02-20 LAB — AMYLASE: Amylase: 62 U/L (ref 27–131)

## 2017-02-20 NOTE — Telephone Encounter (Signed)
Craig Dred[facility]ward, do you want a 30 minute apt to establish care?

## 2017-02-20 NOTE — Patient Instructions (Addendum)
You appear to have recent flare of reflux and associate burning type pain in back at approximate same level as epigastric area. On exam no pain in back on range of motion and no pain on palpation. The pain in back does not appear muscles or mid spine in origin.   I want to see how your stomach and back pain responds to 3-5 days of daily nexium.  Get labs today cmp, cbc, amylase, lipase and ua. Update me on Friday how you are may get US abdomen or maybe even xray spine.  Follow up 7 days or as needed

## 2017-02-20 NOTE — Telephone Encounter (Signed)
Future amylase and lipase order placed
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
## 2017-02-20 NOTE — Telephone Encounter (Signed)
I saw patient today and am familiar with his history. So don't technically need appointment to establish care with me.

## 2017-02-23 ENCOUNTER — Encounter: Payer: Self-pay | Admitting: Medical

## 2017-02-24 ENCOUNTER — Encounter (HOSPITAL_BASED_OUTPATIENT_CLINIC_OR_DEPARTMENT_OTHER): Payer: Self-pay | Admitting: Emergency Medicine

## 2017-02-24 ENCOUNTER — Encounter: Payer: Self-pay | Admitting: Medical

## 2017-02-24 ENCOUNTER — Emergency Department (HOSPITAL_BASED_OUTPATIENT_CLINIC_OR_DEPARTMENT_OTHER)
Admission: EM | Admit: 2017-02-24 | Discharge: 2017-02-24 | Disposition: A | Discharge status: Home / Self Care | Payer: BC Managed Care – PPO | Attending: Emergency Medicine | Admitting: Emergency Medicine

## 2017-02-24 DIAGNOSIS — K2900 Acute gastritis without bleeding: Secondary | ICD-10-CM | POA: Diagnosis not present

## 2017-02-24 DIAGNOSIS — R1012 Left upper quadrant pain: Secondary | ICD-10-CM

## 2017-02-24 DIAGNOSIS — Z79899 Other long term (current) drug therapy: Secondary | ICD-10-CM | POA: Diagnosis not present

## 2017-02-24 LAB — CBC WITH DIFFERENTIAL/PLATELET
Basophils Absolute: 0.1 10*3/uL (ref 0.0–0.1)
Basophils Relative: 1 %
EOS ABS: 0.1 10*3/uL (ref 0.0–0.7)
EOS PCT: 1 %
HCT: 43.3 % (ref 39.0–52.0)
Hemoglobin: 15.3 g/dL (ref 13.0–17.0)
LYMPHS PCT: 36 %
Lymphs Abs: 3.1 10*3/uL (ref 0.7–4.0)
MCH: 30.7 pg (ref 26.0–34.0)
MCHC: 35.3 g/dL (ref 30.0–36.0)
MCV: 86.8 fL (ref 78.0–100.0)
MONO ABS: 0.6 10*3/uL (ref 0.1–1.0)
MONOS PCT: 7 %
Neutro Abs: 4.7 10*3/uL (ref 1.7–7.7)
Neutrophils Relative %: 55 %
PLATELETS: 209 10*3/uL (ref 150–400)
RBC: 4.99 MIL/uL (ref 4.22–5.81)
RDW: 12.2 % (ref 11.5–15.5)
WBC: 8.6 10*3/uL (ref 4.0–10.5)

## 2017-02-24 LAB — COMPREHENSIVE METABOLIC PANEL
ALT: 27 U/L (ref 17–63)
ANION GAP: 7 (ref 5–15)
AST: 23 U/L (ref 15–41)
Albumin: 4.5 g/dL (ref 3.5–5.0)
Alkaline Phosphatase: 80 U/L (ref 38–126)
BUN: 12 mg/dL (ref 6–20)
CALCIUM: 9.7 mg/dL (ref 8.9–10.3)
CHLORIDE: 103 mmol/L (ref 101–111)
CO2: 28 mmol/L (ref 22–32)
Creatinine, Ser: 1.13 mg/dL (ref 0.61–1.24)
Glucose, Bld: 118 mg/dL — ABNORMAL HIGH (ref 65–99)
Potassium: 4 mmol/L (ref 3.5–5.1)
Sodium: 138 mmol/L (ref 135–145)
Total Bilirubin: 0.8 mg/dL (ref 0.3–1.2)
Total Protein: 7.9 g/dL (ref 6.5–8.1)

## 2017-02-24 LAB — LIPASE, BLOOD: LIPASE: 30 U/L (ref 11–51)

## 2017-02-24 NOTE — ED Triage Notes (Signed)
LLQ pain x 1 week. Denies N/V/D

## 2017-02-24 NOTE — Discharge Instructions (Signed)
Continue nexium  twice daily for 2 weeks.
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
## 2017-02-24 NOTE — ED Notes (Signed)
Alert, NAD, calm, interactive, resps e/u, speaking in clear complete sentences, no dyspnea noted, skin W&D, VSS, admits to some continued moderate pain continues, (denies:sob, nausea, dizziness or visual changes). Family at [facility]. Denies questions or needs.

## 2017-02-24 NOTE — ED Notes (Signed)
Pt was seen at [facility] earlier this week. Had elevated lipase there. Pt was told to go back and have blood redrawn on Monday. Pt reports continued burning pain in left upper quadrant.

## 2017-02-24 NOTE — ED Notes (Signed)
EDP at [facility] 

## 2017-02-26 ENCOUNTER — Ambulatory Visit (INDEPENDENT_AMBULATORY_CARE_PROVIDER_SITE_OTHER): Payer: BC Managed Care – PPO | Admitting: Medical

## 2017-02-26 ENCOUNTER — Encounter: Payer: Self-pay | Admitting: Medical

## 2017-02-26 VITALS — BP 121/88 | HR 66 | Temp 98.4°F | Resp 16 | Ht 65.0 in | Wt 132.6 lb

## 2017-02-26 DIAGNOSIS — R109 Unspecified abdominal pain: Secondary | ICD-10-CM | POA: Diagnosis not present

## 2017-02-26 DIAGNOSIS — K2900 Acute gastritis without bleeding: Secondary | ICD-10-CM | POA: Diagnosis not present
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
## 2017-02-26 NOTE — Patient Instructions (Addendum)
For your abdomen pain/continue nexium and can add zantac otc. Continue healthy bland diet.   Lab work recently normal.  Will refer to Dr. Leone Payor for likely EGD.   If pain worsens please let me know and may try to expedite the appointment to GI. Also pending GI may expand work up if needed  Follow up 1 months or as needed.

## 2017-03-02 ENCOUNTER — Encounter: Payer: Self-pay | Admitting: Medical

## 2017-03-03 ENCOUNTER — Telehealth: Payer: Self-pay | Admitting: Medical

## 2017-03-03 MED ORDER — SUCRALFATE 1 G PO TABS: 1.0000 g | ORAL_TABLET | Freq: Three times a day (TID) | ORAL | 0 refills | Status: DC

## 2017-03-03 NOTE — Telephone Encounter (Signed)
Prescription of Carafate sent to patient's pharmacy. I will advise him that I sent the prescription or my chart.

## 2017-03-03 NOTE — Telephone Encounter (Signed)
Patient requesting quicker appointment with GI. He is already scheduled for October 2 so this is probably unlikely but he still wants [place] to call.  Also he had requested to transfer to me to be his PCP. I okayed switch to me and Melissa did as well. But he still listed under Melissa?

## 2017-03-07 NOTE — Telephone Encounter (Signed)
Hi Jen,  10/2 is the first available that our office has. I will keep an eye out for any openings.  Thank You Judeth Cornfield

## 2017-03-07 NOTE — Telephone Encounter (Signed)
PCP has been switched, I will check with GI to see if they have a sooner appointment.

## 2017-03-13 ENCOUNTER — Encounter: Payer: Self-pay | Admitting: Physician Assistant

## 2017-03-13 ENCOUNTER — Ambulatory Visit (INDEPENDENT_AMBULATORY_CARE_PROVIDER_SITE_OTHER): Payer: BC Managed Care – PPO | Admitting: Physician Assistant

## 2017-03-13 VITALS — BP 100/68 | HR 64 | Ht 63.0 in | Wt 132.2 lb

## 2017-03-13 DIAGNOSIS — K219 Gastro-esophageal reflux disease without esophagitis: Secondary | ICD-10-CM | POA: Diagnosis not present

## 2017-03-13 DIAGNOSIS — R1012 Left upper quadrant pain: Secondary | ICD-10-CM | POA: Diagnosis not present

## 2017-03-13 MED ORDER — RANITIDINE HCL 150 MG PO TABS: 150.0000 mg | ORAL_TABLET | Freq: Every day | ORAL | 2 refills | Status: DC

## 2017-03-13 NOTE — Patient Instructions (Signed)
We have sent the following medications to your pharmacy for you to pick up at your convenience: Zantac 150 mg as needed   Take Gas X with a meal.   Continue Esomeprazole 20 mg twice a day
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
## 2017-04-11 ENCOUNTER — Telehealth: Payer: Self-pay | Admitting: Physician Assistant

## 2017-04-11 NOTE — Telephone Encounter (Signed)
The pt confirmed he has not been taking his medications as advised.  I informed him to take the nexium twice daily as advised, zantac 150 mg at bedtime and gas x at meals.  He will call next week if his symptoms do not resolve.

## 2017-04-11 NOTE — Telephone Encounter (Signed)
Left message on machine to call back  

## 2017-04-24 ENCOUNTER — Encounter: Payer: Self-pay | Admitting: Internal Medicine

## 2017-04-24 ENCOUNTER — Ambulatory Visit: Payer: BC Managed Care – PPO | Admitting: Internal Medicine

## 2017-04-24 VITALS — BP 100/72 | HR 60 | Ht 63.0 in | Wt 132.0 lb

## 2017-04-24 DIAGNOSIS — G8929 Other chronic pain: Secondary | ICD-10-CM

## 2017-04-24 DIAGNOSIS — R1012 Left upper quadrant pain: Secondary | ICD-10-CM | POA: Diagnosis not present

## 2017-04-24 DIAGNOSIS — K219 Gastro-esophageal reflux disease without esophagitis: Secondary | ICD-10-CM | POA: Diagnosis not present

## 2017-04-24 DIAGNOSIS — K2970 Gastritis, unspecified, without bleeding: Secondary | ICD-10-CM | POA: Diagnosis not present

## 2017-04-24 MED ORDER — ESOMEPRAZOLE MAGNESIUM 40 MG PO CPDR: 40.0000 mg | DELAYED_RELEASE_CAPSULE | Freq: Two times a day (BID) | ORAL | 3 refills | Status: DC

## 2017-04-24 MED FILL — ESOMEPRAZOLE MAG DR 40 MG C: 40 | 30 days supply | Qty: 60 | Fill #0
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
## 2017-04-24 NOTE — Patient Instructions (Addendum)
Wait at least 3 hours before lying down after eating.  We have sent the following medications to your pharmacy for you to pick up at your convenience: Generic Nexium   I appreciate the opportunity to care for you. Stan Headarl Gessner, MD, [facility]

## 2017-05-22 ENCOUNTER — Ambulatory Visit: Payer: BC Managed Care – PPO | Admitting: Family Medicine

## 2017-05-23 ENCOUNTER — Ambulatory Visit: Payer: BC Managed Care – PPO | Admitting: Medical

## 2017-05-23 ENCOUNTER — Encounter: Payer: Self-pay | Admitting: Medical

## 2017-05-23 VITALS — BP 134/80 | HR 71 | Temp 98.0°F | Ht 63.0 in | Wt 131.0 lb

## 2017-05-23 DIAGNOSIS — R1013 Epigastric pain: Secondary | ICD-10-CM | POA: Diagnosis not present

## 2017-05-23 DIAGNOSIS — Z23 Encounter for immunization: Secondary | ICD-10-CM | POA: Diagnosis not present
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
## 2017-05-23 NOTE — Patient Instructions (Signed)
For your history of abdomen pain/epigastric region, I do recommend that you try to follow GI MDs recommendation of Nexium 40 mg twice daily.  You expressed some reluctance with this so after discussion I think you could try 40 mg in the morning and then 20 mg in the evening.  Continue healthy diet for your stomach.  If with above still have abdomen pain let me know and I might consider getting an abdomen ultrasound.  Please give me an update in a week.  If your pain worsens before then let me know.  Do recommend following up with Dr. Leone PayorGessner January as is no mention.  It sounds like he is considering doing an EGD if your symptoms do not clear up completely.  Follow-up with me to be determined/depending on how you respond to the increase dosing of Nexium.  If he start Nexium stop Prilosec.

## 2017-05-27 ENCOUNTER — Encounter: Payer: Self-pay | Admitting: Internal Medicine

## 2017-05-27 ENCOUNTER — Encounter: Payer: Self-pay | Admitting: Medical

## 2017-05-28 ENCOUNTER — Telehealth: Payer: Self-pay | Admitting: Medical

## 2017-05-28 NOTE — Telephone Encounter (Signed)
Would you call GI office and ask them when is pt follow up with Dr. Leone PayorGessner. Pt states he has constant left side abdomen discomfort. Burning in stomach at night with reflux.   Pt wants Dr. Leone PayorGessner to be aware.

## 2017-05-30 ENCOUNTER — Encounter: Payer: Self-pay | Admitting: Internal Medicine

## 2017-05-30 ENCOUNTER — Ambulatory Visit (AMBULATORY_SURGERY_CENTER): Payer: Self-pay | Admitting: *Deleted

## 2017-05-30 ENCOUNTER — Other Ambulatory Visit: Payer: Self-pay

## 2017-05-30 VITALS — Ht 63.0 in | Wt 133.0 lb

## 2017-05-30 DIAGNOSIS — R1012 Left upper quadrant pain: Secondary | ICD-10-CM

## 2017-05-30 DIAGNOSIS — G8929 Other chronic pain: Secondary | ICD-10-CM

## 2017-05-30 NOTE — Progress Notes (Signed)
Patient denies any allergies to eggs or soy. Patient denies any problems with anesthesia/sedation. Patient denies any oxygen use at home. Patient denies taking any diet/weight loss medications or blood thinners. EMMI education assisgned to patient on EGD, this was explained and instructions given to patient. 

## 2017-06-07 ENCOUNTER — Other Ambulatory Visit: Payer: Self-pay

## 2017-06-07 ENCOUNTER — Encounter: Payer: Self-pay | Admitting: Internal Medicine

## 2017-06-07 ENCOUNTER — Ambulatory Visit (AMBULATORY_SURGERY_CENTER): Payer: BC Managed Care – PPO | Admitting: Internal Medicine

## 2017-06-07 VITALS — BP 105/68 | HR 83 | Temp 98.4°F | Resp 21 | Ht 63.0 in | Wt 133.0 lb

## 2017-06-07 DIAGNOSIS — R1012 Left upper quadrant pain: Secondary | ICD-10-CM | POA: Diagnosis not present

## 2017-06-07 MED ORDER — SODIUM CHLORIDE 0.9 % IV SOLN: 500.0000 mL | Freq: Once | INTRAVENOUS | Status: DC

## 2017-06-07 NOTE — Progress Notes (Signed)
Report given to PACU, vss 

## 2017-06-07 NOTE — Progress Notes (Signed)
Pt's states no medical or surgical changes since previsit or office visit. 

## 2017-06-07 NOTE — Patient Instructions (Addendum)
YOU HAD AN ENDOSCOPIC PROCEDURE TODAY AT THE [facility]:   Refer to the procedure report that was given to you for any specific questions about what was found during the examination.  If the procedure report does not answer your questions, please call your gastroenterologist to clarify.  If you requested that your care partner not be given the details of your procedure findings, then the procedure report has been included in a sealed envelope for you to review at your convenience later.  YOU SHOULD EXPECT: Some feelings of bloating in the abdomen. Passage of more gas than usual.  Walking can help get rid of the air that was put into your GI tract during the procedure and reduce the bloating. If you had a lower endoscopy (such as a colonoscopy or flexible sigmoidoscopy) you may notice spotting of blood in your stool or on the toilet paper. If you underwent a bowel prep for your procedure, you may not have a normal bowel movement for a few days.  Please Note:  You might notice some irritation and congestion in your nose or some drainage.  This is from the oxygen used during your procedure.  There is no need for concern and it should clear up in a day or so.  SYMPTOMS TO REPORT IMMEDIATELY:    Following upper endoscopy (EGD)  Vomiting of blood or coffee ground material  New chest pain or pain under the shoulder blades  Painful or persistently difficult swallowing  New shortness of breath  Fever of 100F or higher  Black, tarry-looking stools  For urgent or emergent issues, a gastroenterologist can be reached at any hour by calling [phone].   DIET:  We do recommend a small meal at first, but then you may proceed to your regular diet.  Drink plenty of fluids but you should avoid alcoholic beverages for 24 hours.  ACTIVITY:  You should plan to take it easy for the rest of today and you should NOT DRIVE or use heavy machinery until tomorrow (because of the sedation  medicines used during the test).    FOLLOW UP: Our staff will call the number listed on your records the next business day following your procedure to check on you and address any questions or concerns that you may have regarding the information given to you following your procedure. If we do not reach you, we will leave a message.  However, if you are feeling well and you are not experiencing any problems, there is no need to return our call.  We will assume that you have returned to your regular daily activities without incident.  If any biopsies were taken you will be contacted by phone or by letter within the next 1-3 weeks.  Please call us at [phone] if you have not heard about the biopsies in 3 weeks.    SIGNATURES/CONFIDENTIALITY: You and/or your care partner have signed paperwork which will be entered into your electronic medical record.  These signatures attest to the fact that that the information above on your After Visit Summary has been reviewed and is understood.  Full responsibility of the confidentiality of this discharge information lies with you and/or your care-partner.  The endoscopy exam looked normal.  I want you to try an over the counter treatment called FD Delene RuffiniGard. Take it 2 before breakfast and 2 before supper.  If that does not help let me know.  I appreciate the opportunity to care for you. Iva Booparl E. Gessner,  MD, [facility]

## 2017-06-07 NOTE — Op Note (Signed)
[facility] Patient Name: Craig FarrRigel Ramos Procedure Date: 06/07/2017 2:20 PM MRN: [id number] Endoscopist: Iva Booparl E Hanae Waiters , MD Age: 46 Referring MD:  Date of Birth: [birth year] Gender: Male Account #: [id number] Procedure:                Upper GI endoscopy Indications:              Abdominal pain in the left upper quadrant Medicines:                Propofol per Anesthesia, Monitored Anesthesia Care Procedure:                Pre-Anesthesia Assessment:                           - Prior to the procedure, a History and Physical                            was performed, and patient medications and                            allergies were reviewed. The patient's tolerance of                            previous anesthesia was also reviewed. The risks                            and benefits of the procedure and the sedation                            options and risks were discussed with the patient.                            All questions were answered, and informed consent                            was obtained. Prior Anticoagulants: The patient has                            taken no previous anticoagulant or antiplatelet                            agents. ASA Grade Assessment: I - A normal, healthy                            patient. After reviewing the risks and benefits,                            the patient was deemed in satisfactory condition to                            undergo the procedure.                           After obtaining informed consent, the endoscope was  passed under direct vision. Throughout the                            procedure, the patient's blood pressure, pulse, and                            oxygen saturations were monitored continuously. The                            Endoscope was introduced through the mouth, and                            advanced to the second part of duodenum. The upper                            GI  endoscopy was accomplished without difficulty.                            The patient tolerated the procedure well. Scope In: Scope Out: Findings:                 The esophagus was normal.                           The stomach was normal.                           The examined duodenum was normal.                           The cardia and gastric fundus were normal on                            retroflexion. Complications:            No immediate complications. Estimated Blood Loss:     Estimated blood loss: none. Impression:               - Normal esophagus.                           - Normal stomach.                           - Normal examined duodenum.                           - No specimens collected. Recommendation:           - Patient has a contact number available for                            emergencies. The signs and symptoms of potential                            delayed complications were discussed with the  patient. Return to normal activities tomorrow.                            Written discharge instructions were provided to the                            patient.                           - Resume previous diet.                           - Continue present medications.                           - Try FD Delene RuffiniGard 2 before breakfast, 2 before supper                           If that does not help will consider TCS Tx for                            chroic LUq pain Iva Booparl E Dyrell Tuccillo, MD 06/07/2017 2:43:32 PM This report has been signed electronically.

## 2017-06-08 ENCOUNTER — Telehealth: Payer: Self-pay | Admitting: *Deleted

## 2017-06-08 IMAGING — DX DG HIP (WITH OR WITHOUT PELVIS) 2-3V*L*
3 series · 3 of 3 positions shown · non-contrast
Comparison: None.

CLINICAL DATA: Left hip pain.  No known injury.

EXAM:
DG HIP (WITH OR WITHOUT PELVIS) 2-3V LEFT

[pelvis ap]
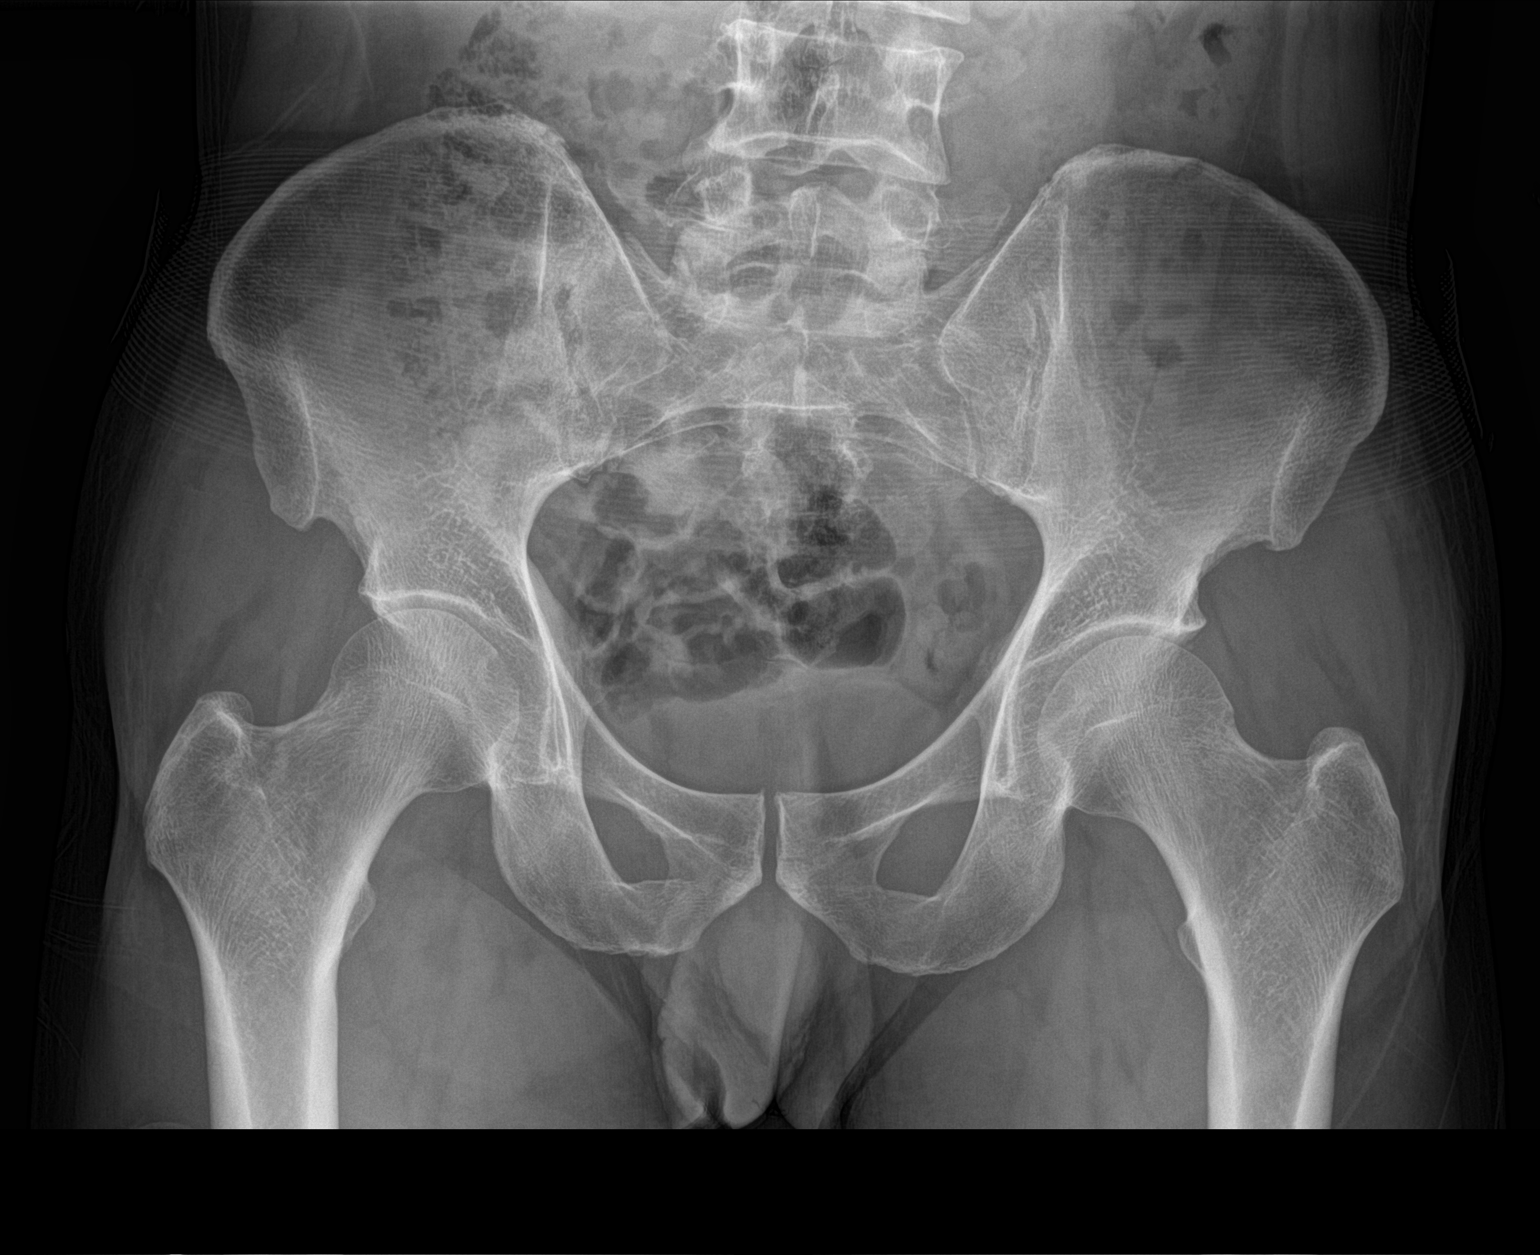

[hip ap]
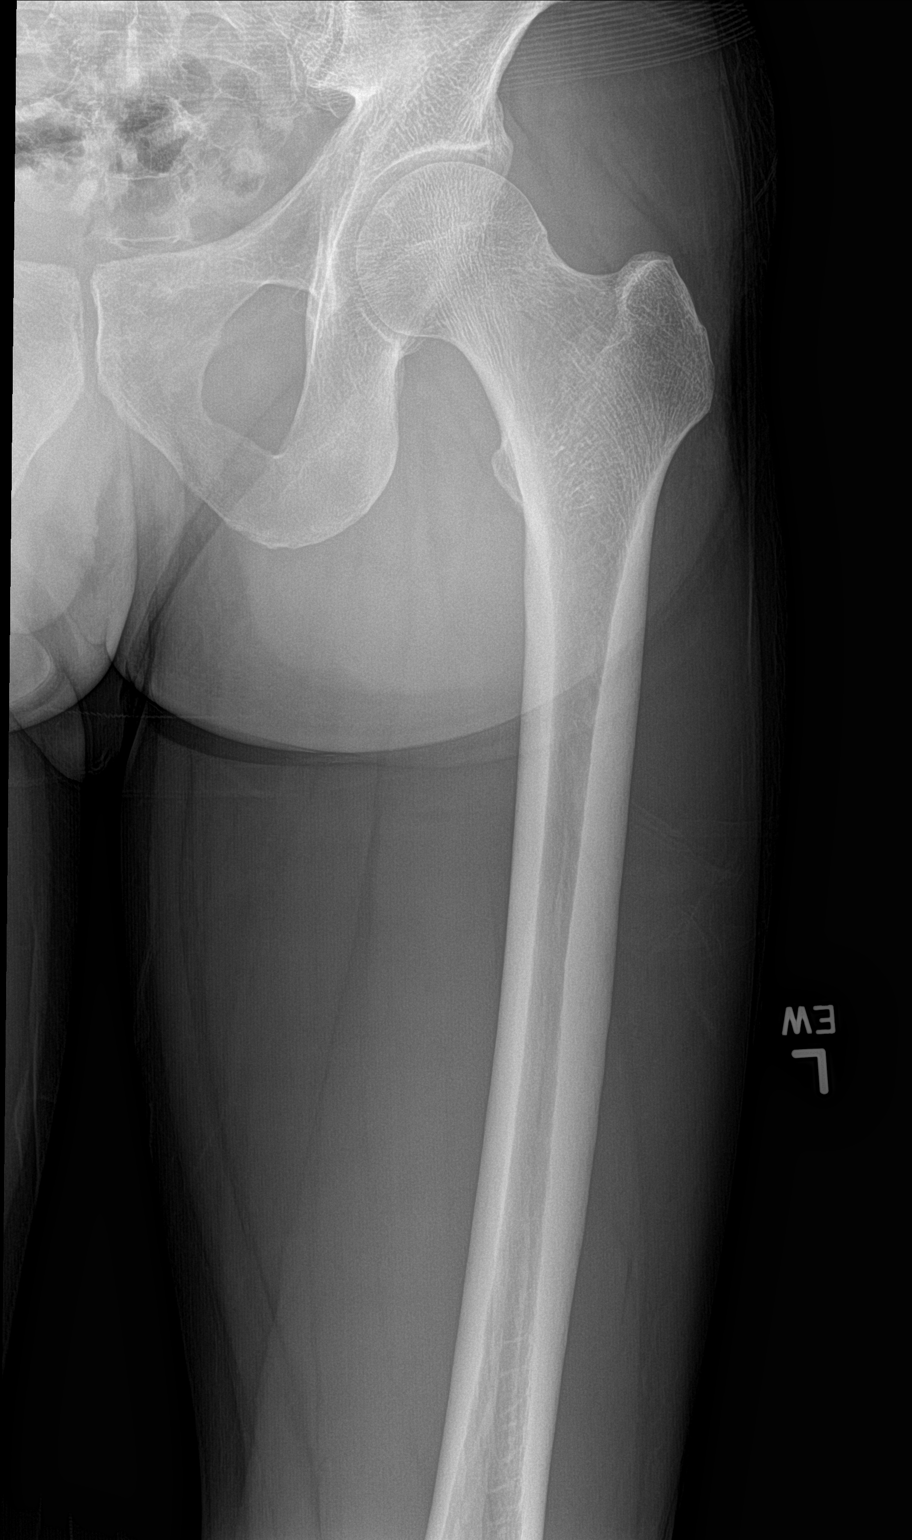

[hip lat]
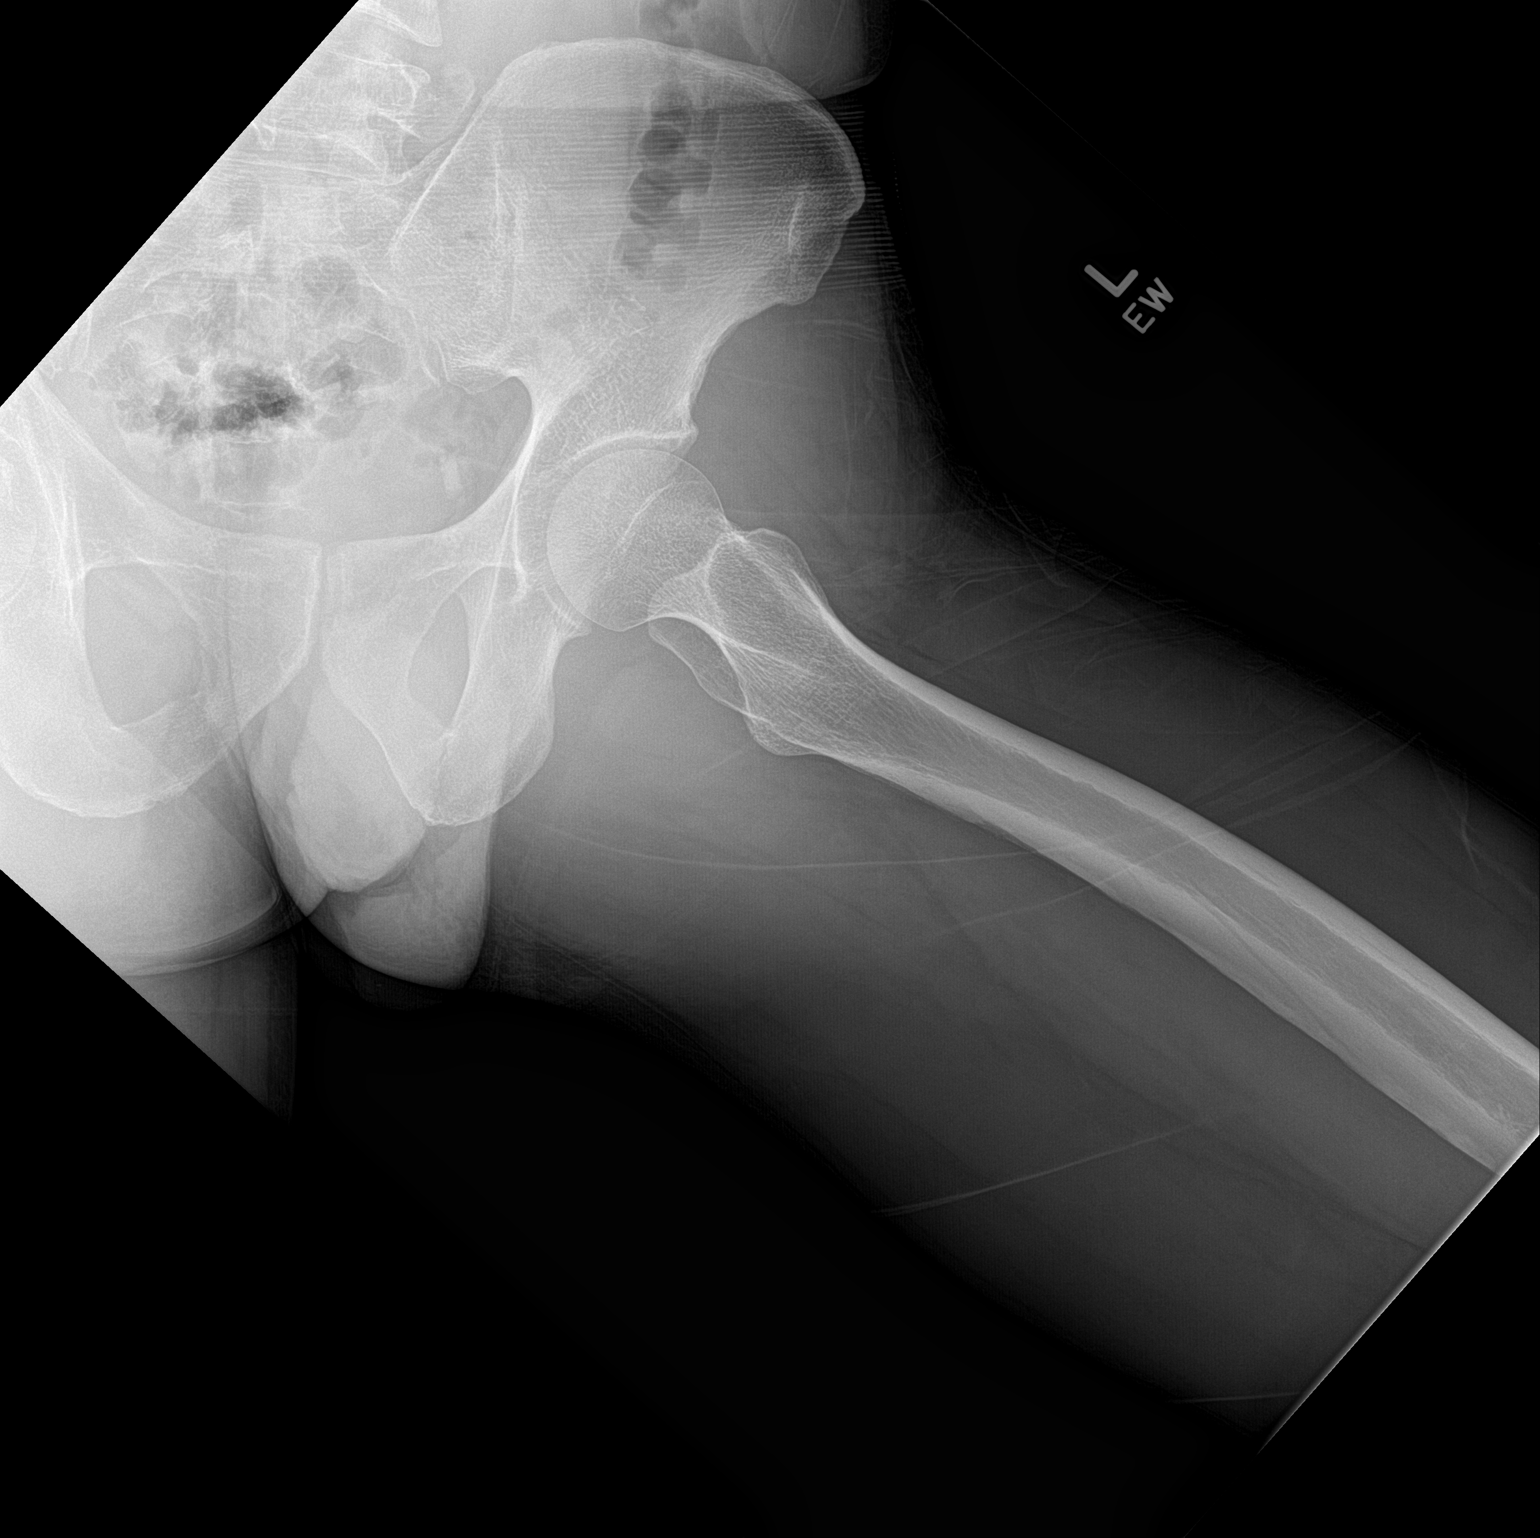

[3 of 3 positions shown; findings below may reference images not displayed]

FINDINGS: There is no evidence of hip fracture or dislocation. There is no
evidence of arthropathy or other focal bone abnormality.
IMPRESSION: Negative.

## 2017-06-08 NOTE — Telephone Encounter (Signed)
  Follow up Call-  Call back number 06/07/2017 03/30/2016  Post procedure Call Back phone  # [phone]  Permission to leave phone message Yes Yes  Some recent data might be hidden     Patient questions:  Do you have a fever, pain , or abdominal swelling? No. Pain Score  0 *  Have you tolerated food without any problems? Yes.    Have you been able to return to your normal activities? Yes.    Do you have any questions about your discharge instructions: Diet   No. Medications  No. Follow up visit  No.  Do you have questions or concerns about your Care? No.  Actions: * If pain score is 4 or above: No action needed, pain <4.

## 2017-06-30 ENCOUNTER — Other Ambulatory Visit: Payer: Self-pay | Admitting: Otolaryngology

## 2017-06-30 DIAGNOSIS — R221 Localized swelling, mass and lump, neck: Secondary | ICD-10-CM

## 2017-07-06 ENCOUNTER — Ambulatory Visit
Admission: RE | Admit: 2017-07-06 | Discharge: 2017-07-06 | Disposition: A | Discharge status: Home / Self Care | Payer: BC Managed Care – PPO | Source: Ambulatory Visit | Attending: Otolaryngology | Admitting: Otolaryngology

## 2017-07-06 DIAGNOSIS — R221 Localized swelling, mass and lump, neck: Secondary | ICD-10-CM

## 2017-07-06 MED ORDER — IOPAMIDOL (ISOVUE-300) INJECTION 61%
75.0000 mL | Freq: Once | INTRAVENOUS | Status: AC | PRN
  Administered 2017-07-06: 75 mL via INTRAVENOUS

## 2017-11-27 MED FILL — ESOMEPRAZOLE MAG DR 40 MG C: 40 | 30 days supply | Qty: 60 | Fill #1

## 2017-12-14 ENCOUNTER — Encounter: Payer: Self-pay | Admitting: Medical

## 2017-12-14 ENCOUNTER — Ambulatory Visit: Payer: BC Managed Care – PPO | Admitting: Medical

## 2017-12-14 ENCOUNTER — Telehealth: Payer: Self-pay | Admitting: Medical

## 2017-12-14 VITALS — BP 115/81 | HR 59 | Temp 98.2°F | Resp 16 | Ht 63.0 in | Wt 136.8 lb

## 2017-12-14 DIAGNOSIS — R739 Hyperglycemia, unspecified: Secondary | ICD-10-CM | POA: Diagnosis not present

## 2017-12-14 DIAGNOSIS — E785 Hyperlipidemia, unspecified: Secondary | ICD-10-CM

## 2017-12-14 DIAGNOSIS — R350 Frequency of micturition: Secondary | ICD-10-CM

## 2017-12-14 DIAGNOSIS — K219 Gastro-esophageal reflux disease without esophagitis: Secondary | ICD-10-CM

## 2017-12-14 LAB — POC URINALSYSI DIPSTICK (AUTOMATED)
Bilirubin, UA: NEGATIVE
GLUCOSE UA: NEGATIVE
Ketones, UA: NEGATIVE
LEUKOCYTES UA: NEGATIVE
NITRITE UA: NEGATIVE
Protein, UA: NEGATIVE
RBC UA: NEGATIVE
Spec Grav, UA: 1.015 (ref 1.010–1.025)
UROBILINOGEN UA: NEGATIVE U/dL — AB
pH, UA: 6 (ref 5.0–8.0)

## 2017-12-14 MED ORDER — PANTOPRAZOLE SODIUM 40 MG PO TBEC: 40.0000 mg | DELAYED_RELEASE_TABLET | Freq: Every day | ORAL | 3 refills | Status: DC

## 2017-12-14 MED FILL — PANTOPRAZOLE SOD DR 40 MG T: 40 | 30 days supply | Qty: 30 | Fill #0

## 2017-12-14 NOTE — Addendum Note (Signed)
Addended by: Orlene OchRENCE, Joao Mccurdy N on: 12/14/2017 02:36 PM   Modules accepted: Orders

## 2017-12-14 NOTE — Telephone Encounter (Signed)
Pt urine poct was not resulted. Had to take out to close note. Will you results his urine please. Place order as well.

## 2017-12-14 NOTE — Patient Instructions (Signed)
For your history of chronic abdomen pain/probable GERD, I did prescribe Protonix.  You question whether other PPI might be better than the Nexium.  I think it is worthwhile to try Protonix in place of Nexium.  Continue with strict diet guideline.  Also your gastroenterologist recommended a medication called FD Guard or Gard.  This is probably a probiotic and this may help.  Ask your pharmacist if they are familiar with this over-the-counter medication.  For frequent urination, we did a UA today.  Also on Monday when you get your fasting labs will include PSA.  For elevated blood sugar in the past as well as some increased frequency of urination and increased hunger, we will get A1c with labs drawn on Monday.  For high cholesterol, please lipid panel.  Decided also to include CBC, CMP and pancreas enzymes with the labs will be drawn in light of your chronic intermittent abdomen discomfort.  Follow-up date to be determined after lab review.
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
## 2017-12-17 ENCOUNTER — Other Ambulatory Visit (INDEPENDENT_AMBULATORY_CARE_PROVIDER_SITE_OTHER): Payer: BC Managed Care – PPO

## 2017-12-17 DIAGNOSIS — R748 Abnormal levels of other serum enzymes: Secondary | ICD-10-CM | POA: Diagnosis not present

## 2017-12-17 DIAGNOSIS — R350 Frequency of micturition: Secondary | ICD-10-CM | POA: Diagnosis not present

## 2017-12-17 DIAGNOSIS — R739 Hyperglycemia, unspecified: Secondary | ICD-10-CM | POA: Diagnosis not present

## 2017-12-17 DIAGNOSIS — E785 Hyperlipidemia, unspecified: Secondary | ICD-10-CM | POA: Diagnosis not present

## 2017-12-17 DIAGNOSIS — K219 Gastro-esophageal reflux disease without esophagitis: Secondary | ICD-10-CM | POA: Diagnosis not present

## 2017-12-17 DIAGNOSIS — R1013 Epigastric pain: Secondary | ICD-10-CM

## 2017-12-17 LAB — COMPREHENSIVE METABOLIC PANEL
ALT: 28 U/L (ref 0–53)
AST: 21 U/L (ref 0–37)
Albumin: 4.3 g/dL (ref 3.5–5.2)
Alkaline Phosphatase: 84 U/L (ref 39–117)
BILIRUBIN TOTAL: 1.4 mg/dL — AB (ref 0.2–1.2)
BUN: 18 mg/dL (ref 6–23)
CALCIUM: 9.7 mg/dL (ref 8.4–10.5)
CHLORIDE: 103 meq/L (ref 96–112)
CO2: 29 meq/L (ref 19–32)
Creatinine, Ser: 1.14 mg/dL (ref 0.40–1.50)
GFR: 73.2 mL/min (ref >60.00)
Glucose, Bld: 107 mg/dL — ABNORMAL HIGH (ref 70–99)
Potassium: 3.8 mEq/L (ref 3.5–5.1)
Sodium: 140 mEq/L (ref 135–145)
Total Protein: 6.9 g/dL (ref 6.0–8.3)

## 2017-12-17 LAB — CBC WITH DIFFERENTIAL/PLATELET
Basophils Absolute: 0.1 10*3/uL (ref 0.0–0.1)
Basophils Relative: 1.2 % (ref 0.0–3.0)
EOS PCT: 2.7 % (ref 0.0–5.0)
Eosinophils Absolute: 0.2 10*3/uL (ref 0.0–0.7)
HCT: 42.2 % (ref 39.0–52.0)
Hemoglobin: 14.8 g/dL (ref 13.0–17.0)
LYMPHS ABS: 2.8 10*3/uL (ref 0.7–4.0)
Lymphocytes Relative: 46.7 % — ABNORMAL HIGH (ref 12.0–46.0)
MCHC: 34.9 g/dL (ref 30.0–36.0)
MCV: 89 fl (ref 78.0–100.0)
MONO ABS: 0.5 10*3/uL (ref 0.1–1.0)
MONOS PCT: 7.9 % (ref 3.0–12.0)
NEUTROS ABS: 2.5 10*3/uL (ref 1.4–7.7)
NEUTROS PCT: 41.5 % — AB (ref 43.0–77.0)
PLATELETS: 208 10*3/uL (ref 150.0–400.0)
RBC: 4.75 Mil/uL (ref 4.22–5.81)
RDW: 12.7 % (ref 11.5–15.5)
WBC: 6.1 10*3/uL (ref 4.0–10.5)

## 2017-12-17 LAB — LIPID PANEL
CHOL/HDL RATIO: 3
CHOLESTEROL: 158 mg/dL (ref 0–200)
HDL: 49.3 mg/dL (ref >39.00)
LDL CALC: 86 mg/dL (ref 0–99)
NonHDL: 109
TRIGLYCERIDES: 117 mg/dL (ref 0.0–149.0)
VLDL: 23.4 mg/dL (ref 0.0–40.0)

## 2017-12-17 LAB — AMYLASE: AMYLASE: 39 U/L (ref 27–131)

## 2017-12-17 LAB — LIPASE: Lipase: 32 U/L (ref 11.0–59.0)

## 2017-12-17 LAB — HEMOGLOBIN A1C: HEMOGLOBIN A1C: 5.8 % (ref 4.6–6.5)

## 2017-12-17 LAB — PSA: PSA: 0.67 ng/mL (ref 0.10–4.00)

## 2018-03-15 ENCOUNTER — Ambulatory Visit: Payer: BC Managed Care – PPO | Admitting: Medical

## 2018-03-15 DIAGNOSIS — Z0289 Encounter for other administrative examinations: Secondary | ICD-10-CM

## 2018-03-18 ENCOUNTER — Encounter: Payer: Self-pay | Admitting: Medical

## 2018-03-18 ENCOUNTER — Ambulatory Visit: Payer: BC Managed Care – PPO | Admitting: Medical

## 2018-03-18 VITALS — BP 119/76 | HR 57 | Temp 98.2°F | Resp 16 | Ht 63.0 in | Wt 129.6 lb

## 2018-03-18 DIAGNOSIS — R350 Frequency of micturition: Secondary | ICD-10-CM

## 2018-03-18 DIAGNOSIS — R300 Dysuria: Secondary | ICD-10-CM

## 2018-03-18 LAB — POC URINALSYSI DIPSTICK (AUTOMATED)
Bilirubin, UA: NEGATIVE
Blood, UA: NEGATIVE
Glucose, UA: NEGATIVE
KETONES UA: NEGATIVE
LEUKOCYTES UA: NEGATIVE
NITRITE UA: NEGATIVE
PH UA: 6.5 (ref 5.0–8.0)
PROTEIN UA: NEGATIVE
Spec Grav, UA: 1.01 (ref 1.010–1.025)
UROBILINOGEN UA: NEGATIVE U/dL — AB

## 2018-03-18 LAB — COMPREHENSIVE METABOLIC PANEL
ALT: 23 U/L (ref 0–53)
AST: 23 U/L (ref 0–37)
Albumin: 4.4 g/dL (ref 3.5–5.2)
Alkaline Phosphatase: 80 U/L (ref 39–117)
BUN: 13 mg/dL (ref 6–23)
CO2: 31 meq/L (ref 19–32)
Calcium: 10.1 mg/dL (ref 8.4–10.5)
Chloride: 103 mEq/L (ref 96–112)
Creatinine, Ser: 1.02 mg/dL (ref 0.40–1.50)
GFR: 83.14 mL/min (ref >60.00)
GLUCOSE: 94 mg/dL (ref 70–99)
POTASSIUM: 3.9 meq/L (ref 3.5–5.1)
Sodium: 139 mEq/L (ref 135–145)
Total Bilirubin: 1.3 mg/dL — ABNORMAL HIGH (ref 0.2–1.2)
Total Protein: 7.2 g/dL (ref 6.0–8.3)

## 2018-03-18 LAB — PSA: PSA: 0.82 ng/mL (ref 0.10–4.00)

## 2018-03-18 MED ORDER — DOXYCYCLINE HYCLATE 100 MG PO TABS: 100.0000 mg | ORAL_TABLET | Freq: Two times a day (BID) | ORAL | 0 refills | Status: DC
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
## 2018-03-18 NOTE — Patient Instructions (Signed)
For your history of frequent urination and recent dysuria, we will get metabolic panel, PSA urinalysis and urine culture.  The burning on urination may represent nonspecific urethritis.  Since this is her main symptom that you have, I will prescribe doxycycline antibiotic.  Please make sure you have a full stomach before you take doxycycline and also would recommend taking probiotics over the next 1 to 2 weeks.   Follow-up in 7 to 10 days or as needed.

## 2018-03-19 LAB — URINE CULTURE
MICRO NUMBER: 91202041
Result:: NO GROWTH
SPECIMEN QUALITY:: ADEQUATE

## 2018-05-15 MED FILL — PANTOPRAZOLE SOD DR 40 MG T: 40 | 30 days supply | Qty: 30 | Fill #1

## 2018-05-20 ENCOUNTER — Other Ambulatory Visit: Payer: Self-pay | Admitting: Internal Medicine

## 2018-07-08 MED FILL — PANTOPRAZOLE SOD DR 40 MG T: 40 | 30 days supply | Qty: 30 | Fill #2

## 2018-08-07 MED FILL — PANTOPRAZOLE SOD DR 40 MG T: 40 | 30 days supply | Qty: 30 | Fill #3 | Status: TO

## 2018-09-03 ENCOUNTER — Other Ambulatory Visit: Payer: Self-pay | Admitting: Medical

## 2018-09-05 MED FILL — PANTOPRAZOLE SOD DR 40 MG T: 40 | 30 days supply | Qty: 30 | Fill #0

## 2018-10-07 MED FILL — PANTOPRAZOLE SOD DR 40 MG T: 40 | 30 days supply | Qty: 30 | Fill #1

## 2018-11-05 MED FILL — PANTOPRAZOLE SOD DR 40 MG T: 40 | 30 days supply | Qty: 30 | Fill #2

## 2018-11-27 ENCOUNTER — Ambulatory Visit: Payer: Self-pay | Admitting: Medical

## 2018-11-27 NOTE — Telephone Encounter (Signed)
Appt scheduled for tomorrow.  °

## 2018-11-27 NOTE — Patient Instructions (Addendum)
For recent palpitations, we got ekg today. Ekg today showed sinus rhythm rate 59(no ischemia). Compared to prior.  Avoid any caffeine, sudafed or stimulants. Will go ahead and refer to cardiology as it appears on prior work up no holter was done. Holter monitor might be able to catch palpitation.  If any constant sustained palpitation or constant chest pain then ED evaluation.  For gerd continue nexium  If you have smart watch check to see if  you have fast hr over 100 or hr less than 60.  Follow up in 2 weeks with me if appontment cardiology delayed or sooner if needed.
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
## 2018-11-27 NOTE — Telephone Encounter (Signed)
Pt reports palpitations, onset Sunday. States 2-3 daily, duration of "Few seconds." Denies CP, no SOB or dizziness. States BP yesterday 110/76, does not recall HR.  Denies caffeine use, denies increased stress, states is staying hydrated.  Call transferred to practice for consideration of appt. Care advise given per protocol.   CB# [phone]   Reason for Disposition . [1] Palpitations AND [2] no improvement after using CARE ADVICE  Answer Assessment - Initial Assessment Questions 1. DESCRIPTION: "Please describe your heart rate or heart beat that you are having" (e.g., fast/slow, regular/irregular, skipped or extra beats, "palpitations")     palpitations 2. ONSET: "When did it start?" (Minutes, hours or days)      Sunday 3. DURATION: "How long does it last" (e.g., seconds, minutes, hours)     Few seconds 4. PATTERN "Does it come and go, or has it been constant since it started?"  "Does it get worse with exertion?"   "Are you feeling it now?"     Comes and goes, not worse with exertion 5. TAP: "Using your hand, can you tap out what you are feeling on a chair or table in front of you, so that I can hear?" (Note: not all patients can do this)       no 6. HEART RATE: "Can you tell me your heart rate?" "How many beats in 15 seconds?"  (Note: not all patients can do this)       no 7. RECURRENT SYMPTOM: "Have you ever had this before?" If so, ask: "When was the last time?" and "What happened that time?"      no 8. CAUSE: "What do you think is causing the palpitations?"     Not sure 9. CARDIAC HISTORY: "Do you have any history of heart disease?" (e.g., heart attack, angina, bypass surgery, angioplasty, arrhythmia)      no 10. OTHER SYMPTOMS: "Do you have any other symptoms?" (e.g., dizziness, chest pain, sweating, difficulty breathing)      none  Protocols used: HEART RATE AND HEARTBEAT QUESTIONS-A-AH

## 2018-11-28 ENCOUNTER — Other Ambulatory Visit: Payer: Self-pay

## 2018-11-28 ENCOUNTER — Ambulatory Visit: Payer: BC Managed Care – PPO | Admitting: Medical

## 2018-11-28 VITALS — BP 123/76 | HR 59 | Temp 98.0°F | Resp 16 | Ht 63.0 in | Wt 137.8 lb

## 2018-11-28 DIAGNOSIS — R002 Palpitations: Secondary | ICD-10-CM

## 2018-11-28 HISTORY — DX: Palpitations: R00.2
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
## 2018-11-29 ENCOUNTER — Encounter: Payer: Self-pay | Admitting: Cardiology

## 2018-11-29 ENCOUNTER — Ambulatory Visit (INDEPENDENT_AMBULATORY_CARE_PROVIDER_SITE_OTHER): Payer: BC Managed Care – PPO | Admitting: Cardiology

## 2018-11-29 VITALS — BP 108/68 | HR 82 | Temp 97.9°F | Ht 63.0 in | Wt 137.4 lb

## 2018-11-29 DIAGNOSIS — J309 Allergic rhinitis, unspecified: Secondary | ICD-10-CM

## 2018-11-29 DIAGNOSIS — R002 Palpitations: Secondary | ICD-10-CM

## 2018-11-29 DIAGNOSIS — K219 Gastro-esophageal reflux disease without esophagitis: Secondary | ICD-10-CM

## 2018-11-29 NOTE — Patient Instructions (Signed)
Medication Instructions:  Your physician recommends that you continue on your current medications as directed. Please refer to the Current Medication list given to you today.  If you need a refill on your cardiac medications before your next appointment, please call your pharmacy.   Lab work: None  If you have labs (blood work) drawn today and your tests are completely normal, you will receive your results only by: Marland Kitchen MyChart Message (if you have MyChart) OR . A paper copy in the mail If you have any lab test that is abnormal or we need to change your treatment, we will call you to review the results.  Testing/Procedures: Your physician has recommended that you wear a ZIO monitor. ZIO monitors are medical devices that record the heart's electrical activity. Doctors most often use these monitors to diagnose arrhythmias. Arrhythmias are problems with the speed or rhythm of the heartbeat. The monitor is a small, portable device. You can wear one while you do your normal daily activities. This is usually used to diagnose what is causing palpitations/syncope (passing out). Wear for 7 days.   Follow-Up: At [facility], you and your health needs are our priority.  As part of our continuing mission to provide you with exceptional heart care, we have created designated Provider Care Teams.  These Care Teams include your primary Cardiologist (physician) and Advanced Practice Providers (APPs -  Physician Assistants and Nurse Practitioners) who all work together to provide you with the care you need, when you need it. You will need a follow up appointment in 1 months.

## 2018-12-06 ENCOUNTER — Other Ambulatory Visit (INDEPENDENT_AMBULATORY_CARE_PROVIDER_SITE_OTHER): Payer: BC Managed Care – PPO

## 2018-12-06 DIAGNOSIS — R002 Palpitations: Secondary | ICD-10-CM | POA: Diagnosis not present

## 2018-12-23 MED FILL — PANTOPRAZOLE SOD DR 40 MG T: 40 | 30 days supply | Qty: 30 | Fill #3
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
## 2019-01-03 ENCOUNTER — Encounter: Payer: Self-pay | Admitting: Cardiology

## 2019-01-03 ENCOUNTER — Ambulatory Visit (INDEPENDENT_AMBULATORY_CARE_PROVIDER_SITE_OTHER): Payer: BC Managed Care – PPO | Admitting: Cardiology

## 2019-01-03 ENCOUNTER — Other Ambulatory Visit: Payer: Self-pay

## 2019-01-03 VITALS — BP 112/82 | HR 64 | Temp 98.2°F | Ht 64.0 in | Wt 136.8 lb

## 2019-01-03 DIAGNOSIS — R002 Palpitations: Secondary | ICD-10-CM | POA: Diagnosis not present

## 2019-01-03 NOTE — Patient Instructions (Signed)
Medication Instructions:  Your physician recommends that you continue on your current medications as directed. Please refer to the Current Medication list given to you today.  If you need a refill on your cardiac medications before your next appointment, please call your pharmacy.   Lab work: None If you have labs (blood work) drawn today and your tests are completely normal, you will receive your results only by: Marland Kitchen MyChart Message (if you have MyChart) OR . A paper copy in the mail If you have any lab test that is abnormal or we need to change your treatment, we will call you to review the results.  Testing/Procedures: None  Follow-Up: At [facility], you and your health needs are our priority.  As part of our continuing mission to provide you with exceptional heart care, we have created designated Provider Care Teams.  These Care Teams include your primary Cardiologist (physician) and Advanced Practice Providers (APPs -  Physician Assistants and Nurse Practitioners) who all work together to provide you with the care you need, when you need it. You will need a follow up appointment as needed with Dr. Bettina Gavia Any Other Special Instructions Will Be Listed Below (If Applicable).

## 2019-01-23 MED FILL — PANTOPRAZOLE SOD DR 40 MG T: 40 | 30 days supply | Qty: 30 | Fill #0

## 2019-02-04 MED FILL — PANTOPRAZOLE SOD DR 40 MG T: 40 | 30 days supply | Qty: 30 | Fill #0

## 2019-03-31 MED FILL — PANTOPRAZOLE SOD DR 40 MG T: 40 | 30 days supply | Qty: 30 | Fill #1

## 2019-04-09 MED FILL — PANTOPRAZOLE SOD DR 40 MG T: 40 | 30 days supply | Qty: 30 | Fill #1

## 2019-05-12 ENCOUNTER — Ambulatory Visit: Payer: BC Managed Care – PPO | Admitting: Medical

## 2019-05-12 ENCOUNTER — Other Ambulatory Visit: Payer: Self-pay

## 2019-05-12 ENCOUNTER — Encounter: Payer: Self-pay | Admitting: Medical

## 2019-05-12 VITALS — BP 130/80 | HR 82 | Temp 97.5°F | Resp 16 | Ht 64.0 in | Wt 139.8 lb

## 2019-05-12 DIAGNOSIS — Z23 Encounter for immunization: Secondary | ICD-10-CM | POA: Diagnosis not present

## 2019-05-12 DIAGNOSIS — K219 Gastro-esophageal reflux disease without esophagitis: Secondary | ICD-10-CM

## 2019-05-12 DIAGNOSIS — R350 Frequency of micturition: Secondary | ICD-10-CM | POA: Diagnosis not present

## 2019-05-12 DIAGNOSIS — R102 Pelvic and perineal pain: Secondary | ICD-10-CM

## 2019-05-12 LAB — POC URINALSYSI DIPSTICK (AUTOMATED)
Bilirubin, UA: NEGATIVE
Blood, UA: NEGATIVE
Glucose, UA: NEGATIVE
Ketones, UA: NEGATIVE
Leukocytes, UA: NEGATIVE
Nitrite, UA: NEGATIVE
Protein, UA: NEGATIVE
Spec Grav, UA: 1.015 (ref 1.010–1.025)
Urobilinogen, UA: NEGATIVE E.U./dL — AB
pH, UA: 6 (ref 5.0–8.0)

## 2019-05-12 MED ORDER — CIPROFLOXACIN HCL 500 MG PO TABS: 500.0000 mg | ORAL_TABLET | Freq: Two times a day (BID) | ORAL | 0 refills | Status: DC

## 2019-05-12 MED ORDER — ESOMEPRAZOLE MAGNESIUM 20 MG PO CPDR: 20.0000 mg | DELAYED_RELEASE_CAPSULE | Freq: Two times a day (BID) | ORAL | 11 refills | Status: DC

## 2019-05-12 MED FILL — ESOMEPRAZOLE MAG DR 20 MG C: 20 | 30 days supply | Qty: 60 | Fill #0

## 2019-05-12 MED FILL — CIPROFLOXACIN HCL 500 MG TA: 500 | 5 days supply | Qty: 10 | Fill #0
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
## 2019-05-12 NOTE — Patient Instructions (Signed)
For frequent urination and suprapubic pain, will rx cipro antibiotic, do urine culture and get psa today.  For hx of gerd, I rx'd nexium refill.   Please schedule your annual physical exam.  Follow up date to be determined after study review.

## 2019-05-14 LAB — URINE CULTURE
MICRO NUMBER:: 1145969
Result:: NO GROWTH
SPECIMEN QUALITY:: ADEQUATE

## 2019-06-11 MED FILL — ESOMEPRAZOLE MAG DR 20 MG C: 20 | 30 days supply | Qty: 60 | Fill #1

## 2019-07-10 MED FILL — ESOMEPRAZOLE MAG DR 20 MG C: 20 | 30 days supply | Qty: 60 | Fill #2

## 2019-08-12 MED FILL — ESOMEPRAZOLE MAG DR 20 MG C: 20 | 30 days supply | Qty: 60 | Fill #3

## 2019-08-19 ENCOUNTER — Other Ambulatory Visit: Payer: Self-pay

## 2019-08-20 ENCOUNTER — Ambulatory Visit: Payer: BC Managed Care – PPO | Admitting: Medical

## 2019-08-20 ENCOUNTER — Other Ambulatory Visit: Payer: Self-pay

## 2019-08-20 VITALS — BP 116/70 | HR 66 | Temp 97.0°F | Resp 18 | Ht 63.0 in | Wt 142.0 lb

## 2019-08-20 DIAGNOSIS — R300 Dysuria: Secondary | ICD-10-CM

## 2019-08-20 DIAGNOSIS — R350 Frequency of micturition: Secondary | ICD-10-CM

## 2019-08-20 LAB — POC URINALSYSI DIPSTICK (AUTOMATED)
Bilirubin, UA: NEGATIVE
Blood, UA: NEGATIVE
Glucose, UA: NEGATIVE
Ketones, UA: NEGATIVE
Leukocytes, UA: NEGATIVE
Nitrite, UA: NEGATIVE
Protein, UA: NEGATIVE
Spec Grav, UA: 1.015 (ref 1.010–1.025)
Urobilinogen, UA: 0.2 E.U./dL
pH, UA: 6 (ref 5.0–8.0)

## 2019-08-20 LAB — PSA: PSA: 0.83 ng/mL (ref 0.10–4.00)

## 2019-08-20 MED ORDER — CIPROFLOXACIN HCL 500 MG PO TABS: 500.0000 mg | ORAL_TABLET | Freq: Two times a day (BID) | ORAL | 0 refills | Status: DC

## 2019-08-20 MED FILL — CIPROFLOXACIN HCL 500 MG TA: 500 | 10 days supply | Qty: 20 | Fill #0

## 2019-08-20 NOTE — Addendum Note (Signed)
Addended by: Gwenevere Abbot on: 08/20/2019 08:29 AM   Modules accepted: Orders
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
## 2019-08-20 NOTE — Patient Instructions (Signed)
You have some signs/symptoms possible uti vs vs prostatis rx cipro antibiotic. Urine looks clear today but will sent urine culture out and get psa.  Hydrate well.  Follow up 2-3 weeks or as needed

## 2019-08-21 ENCOUNTER — Telehealth: Payer: Self-pay | Admitting: Medical

## 2019-08-21 ENCOUNTER — Telehealth: Payer: Self-pay

## 2019-08-21 DIAGNOSIS — R300 Dysuria: Secondary | ICD-10-CM

## 2019-08-21 DIAGNOSIS — R350 Frequency of micturition: Secondary | ICD-10-CM

## 2019-08-21 LAB — URINE CULTURE
MICRO NUMBER:: 10235785
Result:: NO GROWTH
SPECIMEN QUALITY:: ADEQUATE

## 2019-08-21 NOTE — Telephone Encounter (Signed)
Referral to urologist placed. Notify pt.

## 2019-08-21 NOTE — Telephone Encounter (Signed)
Patient called in and would like a Referral to a Urologists Please follow up with the patient and advise at [phone] as soon as possible   Thanks,

## 2019-08-21 NOTE — Telephone Encounter (Signed)
Referral to urologist placed. 

## 2019-08-22 NOTE — Telephone Encounter (Signed)
Pt.notified

## 2019-08-25 ENCOUNTER — Encounter: Payer: Self-pay | Admitting: Medical

## 2019-09-10 MED FILL — ESOMEPRAZOLE MAG DR 20 MG C: 20 | 30 days supply | Qty: 60 | Fill #4

## 2019-10-09 MED FILL — ESOMEPRAZOLE MAG DR 20 MG C: 20 | 30 days supply | Qty: 60 | Fill #5

## 2019-11-11 MED FILL — ESOMEPRAZOLE MAG DR 20 MG C: 20 | 30 days supply | Qty: 60 | Fill #6

## 2019-11-16 ENCOUNTER — Encounter (HOSPITAL_COMMUNITY): Payer: Self-pay

## 2019-11-16 ENCOUNTER — Other Ambulatory Visit: Payer: Self-pay

## 2019-11-16 ENCOUNTER — Ambulatory Visit (INDEPENDENT_AMBULATORY_CARE_PROVIDER_SITE_OTHER): Payer: BC Managed Care – PPO

## 2019-11-16 ENCOUNTER — Ambulatory Visit (HOSPITAL_COMMUNITY)
Admission: EM | Admit: 2019-11-16 | Discharge: 2019-11-16 | Disposition: A | Discharge status: Home / Self Care | Payer: BC Managed Care – PPO | Attending: Family Medicine | Admitting: Family Medicine

## 2019-11-16 DIAGNOSIS — S61432A Puncture wound without foreign body of left hand, initial encounter: Secondary | ICD-10-CM

## 2019-11-16 DIAGNOSIS — Z23 Encounter for immunization: Secondary | ICD-10-CM

## 2019-11-16 DIAGNOSIS — M7989 Other specified soft tissue disorders: Secondary | ICD-10-CM | POA: Diagnosis not present

## 2019-11-16 MED ORDER — TETANUS-DIPHTH-ACELL PERTUSSIS 5-2.5-18.5 LF-MCG/0.5 IM SUSP
0.5000 mL | Freq: Once | INTRAMUSCULAR | Status: AC
  Administered 2019-11-16: 0.5 mL via INTRAMUSCULAR

## 2019-11-16 MED ORDER — CEFTRIAXONE SODIUM 1 G IJ SOLR
INTRAMUSCULAR | Status: AC
  Filled 2019-11-16: qty 10

## 2019-11-16 MED ORDER — EYE WASH OPHTH SOLN
OPHTHALMIC | Status: AC
  Filled 2019-11-16: qty 118

## 2019-11-16 MED ORDER — TETANUS-DIPHTH-ACELL PERTUSSIS 5-2.5-18.5 LF-MCG/0.5 IM SUSP
INTRAMUSCULAR | Status: AC
  Filled 2019-11-16: qty 0.5

## 2019-11-16 MED ORDER — LIDOCAINE HCL (PF) 1 % IJ SOLN
INTRAMUSCULAR | Status: AC
  Filled 2019-11-16: qty 4

## 2019-11-16 MED ORDER — FLUORESCEIN SODIUM 1 MG OP STRP
ORAL_STRIP | OPHTHALMIC | Status: AC
  Filled 2019-11-16: qty 1

## 2019-11-16 MED ORDER — CEPHALEXIN 500 MG PO CAPS: 500.0000 mg | ORAL_CAPSULE | Freq: Three times a day (TID) | ORAL | 0 refills | Status: DC

## 2019-11-16 MED ORDER — CEFTRIAXONE SODIUM 1 G IJ SOLR
1.0000 g | Freq: Once | INTRAMUSCULAR | Status: AC
  Administered 2019-11-16: 1 g via INTRAMUSCULAR

## 2019-11-16 MED ORDER — TETRACAINE HCL 0.5 % OP SOLN
OPHTHALMIC | Status: AC
  Filled 2019-11-16: qty 4
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
## 2019-11-16 NOTE — ED Triage Notes (Signed)
PT was off balance and fell and his left hand hit a rusty nail. Pt states the nail went through hand. Pt has 1+ edema of anterior left hand and a puncture wound. Bleeding is controlled.

## 2019-11-16 NOTE — Discharge Instructions (Signed)
Warm soaks for 20 min every couple of hours Take the keflex for a week Elevate hand to reduce pain and swelling Take ibuprofen for pain See orthopedic if pain or swelling worsen

## 2019-12-15 MED FILL — ESOMEPRAZOLE MAG DR 20 MG C: 20 | 30 days supply | Qty: 60 | Fill #7

## 2020-01-16 MED FILL — ESOMEPRAZOLE MAG DR 20 MG C: 20 | 30 days supply | Qty: 60 | Fill #8

## 2020-01-23 ENCOUNTER — Ambulatory Visit: Payer: BC Managed Care – PPO | Admitting: Family Medicine

## 2020-01-23 ENCOUNTER — Encounter: Payer: Self-pay | Admitting: Family Medicine

## 2020-01-23 ENCOUNTER — Other Ambulatory Visit: Payer: Self-pay

## 2020-01-23 VITALS — BP 110/68 | HR 77 | Temp 98.7°F | Ht 64.0 in | Wt 138.0 lb

## 2020-01-23 DIAGNOSIS — M25562 Pain in left knee: Secondary | ICD-10-CM | POA: Diagnosis not present
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
## 2020-01-23 NOTE — Patient Instructions (Signed)
Your knee looks good.   No need to ice, heat, or wrap.  Let us know if you need anything.  Knee Exercises It is normal to feel mild stretching, pulling, tightness, or discomfort as you do these exercises, but you should stop right away if you feel sudden pain or your pain gets worse. STRETCHING AND RANGE OF MOTION EXERCISES  These exercises warm up your muscles and joints and improve the movement and flexibility of your knee. These exercises also help to relieve pain, numbness, and tingling. Exercise A: Knee Extension, Prone  1. Lie on your abdomen on a bed. 2. Place your left / right knee just beyond the edge of the surface so your knee is not on the bed. You can put a towel under your left / right thigh just above your knee for comfort. 3. Relax your leg muscles and allow gravity to straighten your knee. You should feel a stretch behind your left / right knee. 4. Hold this position for 30 seconds. 5. Scoot up so your knee is supported between repetitions. Repeat 2 times. Complete this stretch 3 times per week. Exercise B: Knee Flexion, Active    1. Lie on your back with both knees straight. If this causes back discomfort, bend your left / right knee so your foot is flat on the floor. 2. Slowly slide your left / right heel back toward your buttocks until you feel a gentle stretch in the front of your knee or thigh. 3. Hold this position for 30 seconds. 4. Slowly slide your left / right heel back to the starting position. Repeat 2 times. Complete this exercise 3 times per week. Exercise C: Quadriceps, Prone    1. Lie on your abdomen on a firm surface, such as a bed or padded floor. 2. Bend your left / right knee and hold your ankle. If you cannot reach your ankle or pant leg, loop a belt around your foot and grab the belt instead. 3. Gently pull your heel toward your buttocks. Your knee should not slide out to the side. You should feel a stretch in the front of your thigh and  knee. 4. Hold this position for 30 seconds. Repeat 2 times. Complete this stretch 3 times per week. Exercise D: Hamstring, Supine  1. Lie on your back. 2. Loop a belt or towel over the ball of your left / right foot. The ball of your foot is on the walking surface, right under your toes. 3. Straighten your left / right knee and slowly pull on the belt to raise your leg until you feel a gentle stretch behind your knee. ? Do not let your left / right knee bend while you do this. ? Keep your other leg flat on the floor. 4. Hold this position for 30 seconds. Repeat 2 times. Complete this stretch 3 times per week. STRENGTHENING EXERCISES  These exercises build strength and endurance in your knee. Endurance is the ability to use your muscles for a long time, even after they get tired. Exercise E: Quadriceps, Isometric    1. Lie on your back with your left / right leg extended and your other knee bent. Put a rolled towel or small pillow under your knee if told by your health care provider. 2. Slowly tense the muscles in the front of your left / right thigh. You should see your kneecap slide up toward your hip or see increased dimpling just above the knee. This motion will push the back  of the knee toward the floor. 3. For 3 seconds, keep the muscle as tight as you can without increasing your pain. 4. Relax the muscles slowly and completely. Repeat for 10 total reps Repeat 2 ti mes. Complete this exercise 3 times per week. Exercise F: Straight Leg Raises - Quadriceps  1. Lie on your back with your left / right leg extended and your other knee bent. 2. Tense the muscles in the front of your left / right thigh. You should see your kneecap slide up or see increased dimpling just above the knee. Your thigh may even shake a bit. 3. Keep these muscles tight as you raise your leg 4-6 inches (10-15 cm) off the floor. Do not let your knee bend. 4. Hold this position for 3 seconds. 5. Keep these muscles  tense as you lower your leg. 6. Relax your muscles slowly and completely after each repetition. 10 total reps. Repeat 2 times. Complete this exercise 3 times per week.  Exercise G: Hamstring Curls    If told by your health care provider, do this exercise while wearing ankle weights. Begin with 5 lb weights (optional). Then increase the weight by 1 lb (0.5 kg) increments. Do not wear ankle weights that are more than 20 lbs to start with. 1. Lie on your abdomen with your legs straight. 2. Bend your left / right knee as far as you can without feeling pain. Keep your hips flat against the floor. 3. Hold this position for 3 seconds. 4. Slowly lower your leg to the starting position. Repeat for 10 reps.  Repeat 2 times. Complete this exercise 3 times per week. Exercise H: Squats (Quadriceps)  1. Stand in front of a table, with your feet and knees pointing straight ahead. You may rest your hands on the table for balance but not for support. 2. Slowly bend your knees and lower your hips like you are going to sit in a chair. ? Keep your weight over your heels, not over your toes. ? Keep your lower legs upright so they are parallel with the table legs. ? Do not let your hips go lower than your knees. ? Do not bend lower than told by your health care provider. ? If your knee pain increases, do not bend as low. 3. Hold the squat position for 1 second. 4. Slowly push with your legs to return to standing. Do not use your hands to pull yourself to standing. Repeat 2 times. Complete this exercise 3 times per week. Exercise I: Wall Slides (Quadriceps)    1. Lean your back against a smooth wall or door while you walk your feet out 18-24 inches (46-61 cm) from it. 2. Place your feet hip-width apart. 3. Slowly slide down the wall or door until your knees Repeat 2 times. Complete this exercise every other day. 4. Exercise K: Straight Leg Raises - Hip Abductors  1. Lie on your side with your left / right  leg in the top position. Lie so your head, shoulder, knee, and hip line up. You may bend your bottom knee to help you keep your balance. 2. Roll your hips slightly forward so your hips are stacked directly over each other and your left / right knee is facing forward. 3. Leading with your heel, lift your top leg 4-6 inches (10-15 cm). You should feel the muscles in your outer hip lifting. ? Do not let your foot drift forward. ? Do not let your knee roll toward the  ceiling. 4. Hold this position for 3 seconds. 5. Slowly return your leg to the starting position. 6. Let your muscles relax completely after each repetition. 10 total reps. Repeat 2 times. Complete this exercise 3 times per week. Exercise J: Straight Leg Raises - Hip Extensors  1. Lie on your abdomen on a firm surface. You can put a pillow under your hips if that is more comfortable. 2. Tense the muscles in your buttocks and lift your left / right leg about 4-6 inches (10-15 cm). Keep your knee straight as you lift your leg. 3. Hold this position for 3 seconds. 4. Slowly lower your leg to the starting position. 5. Let your leg relax completely after each repetition. Repeat 2 times. Complete this exercise 3 times per week. Document Released: 04/12/2005 Document Revised: 02/21/2016 Document Reviewed: 04/04/2015 Elsevier Interactive Patient Education  2017 [employer].

## 2020-02-17 MED FILL — ESOMEPRAZOLE MAG DR 20 MG C: 20 | 30 days supply | Qty: 60 | Fill #9

## 2020-03-16 MED FILL — ESOMEPRAZOLE MAG DR 20 MG C: 20 | 30 days supply | Qty: 60 | Fill #10

## 2020-04-16 MED FILL — ESOMEPRAZOLE MAGNESIUM 20 M: 20 | 30 days supply | Qty: 60 | Fill #11

## 2020-05-20 ENCOUNTER — Other Ambulatory Visit: Payer: Self-pay | Admitting: Medical

## 2020-05-21 ENCOUNTER — Other Ambulatory Visit: Payer: Self-pay | Admitting: Medical

## 2020-05-21 MED FILL — ESOMEPRAZOLE MAGNESIUM 20 M: 20 | 30 days supply | Qty: 60 | Fill #0

## 2020-06-15 ENCOUNTER — Ambulatory Visit (HOSPITAL_BASED_OUTPATIENT_CLINIC_OR_DEPARTMENT_OTHER)
Admission: RE | Admit: 2020-06-15 | Discharge: 2020-06-15 | Disposition: A | Discharge status: Home / Self Care | Payer: BC Managed Care – PPO | Source: Ambulatory Visit | Attending: Medical | Admitting: Medical

## 2020-06-15 ENCOUNTER — Ambulatory Visit: Payer: BC Managed Care – PPO | Admitting: Medical

## 2020-06-15 ENCOUNTER — Other Ambulatory Visit: Payer: Self-pay | Admitting: Medical

## 2020-06-15 ENCOUNTER — Other Ambulatory Visit: Payer: Self-pay

## 2020-06-15 VITALS — BP 105/63 | HR 84 | Temp 99.1°F | Resp 12 | Ht 63.0 in | Wt 138.4 lb

## 2020-06-15 DIAGNOSIS — R109 Unspecified abdominal pain: Secondary | ICD-10-CM | POA: Insufficient documentation

## 2020-06-15 MED ORDER — ESOMEPRAZOLE MAGNESIUM 20 MG PO CPDR: DELAYED_RELEASE_CAPSULE | ORAL | 11 refills | Status: DC

## 2020-06-15 MED FILL — ESOMEPRAZOLE MAGNESIUM 20 M: 20 | 30 days supply | Qty: 60 | Fill #0

## 2020-06-15 NOTE — Patient Instructions (Addendum)
You do have history of recent left side abdomen pain. On review negative colonoscopy and negative EGD. Will get labs cbc, cmp and lipase. Also get 1 view abd xray to assess stool burden.   For hx of gerd continue omeprazole. Do recommend that you eat healthy. Avoid alcohol. Rx antibiotic for diverticulitis is consideration if left lower quadrant type pain worsening but want to follow labs and xray first.  If signs as symptoms change or worsen let us know. If pain persisting consider ct imaging and possible referral back to GI MD.  Follow up 7-10 days or as needed
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
## 2020-06-16 LAB — COMPREHENSIVE METABOLIC PANEL
ALT: 37 U/L (ref 0–53)
AST: 21 U/L (ref 0–37)
Albumin: 4.7 g/dL (ref 3.5–5.2)
Alkaline Phosphatase: 99 U/L (ref 39–117)
BUN: 17 mg/dL (ref 6–23)
CO2: 29 mEq/L (ref 19–32)
Calcium: 10.4 mg/dL (ref 8.4–10.5)
Chloride: 103 mEq/L (ref 96–112)
Creatinine, Ser: 1.23 mg/dL (ref 0.40–1.50)
GFR: 68.97 mL/min (ref >60.00)
Glucose, Bld: 134 mg/dL — ABNORMAL HIGH (ref 70–99)
Potassium: 3.7 mEq/L (ref 3.5–5.1)
Sodium: 139 mEq/L (ref 135–145)
Total Bilirubin: 0.8 mg/dL (ref 0.2–1.2)
Total Protein: 7.6 g/dL (ref 6.0–8.3)

## 2020-06-16 LAB — CBC WITH DIFFERENTIAL/PLATELET
Basophils Absolute: 0.1 10*3/uL (ref 0.0–0.1)
Basophils Relative: 0.9 % (ref 0.0–3.0)
Eosinophils Absolute: 0 10*3/uL (ref 0.0–0.7)
Eosinophils Relative: 0.5 % (ref 0.0–5.0)
HCT: 44.3 % (ref 39.0–52.0)
Hemoglobin: 14.8 g/dL (ref 13.0–17.0)
Lymphocytes Relative: 30.1 % (ref 12.0–46.0)
Lymphs Abs: 2.7 10*3/uL (ref 0.7–4.0)
MCHC: 33.5 g/dL (ref 30.0–36.0)
MCV: 89.2 fl (ref 78.0–100.0)
Monocytes Absolute: 0.7 10*3/uL (ref 0.1–1.0)
Monocytes Relative: 8.3 % (ref 3.0–12.0)
Neutro Abs: 5.4 10*3/uL (ref 1.4–7.7)
Neutrophils Relative %: 60.2 % (ref 43.0–77.0)
Platelets: 240 10*3/uL (ref 150.0–400.0)
RBC: 4.97 Mil/uL (ref 4.22–5.81)
RDW: 13 % (ref 11.5–15.5)
WBC: 8.9 10*3/uL (ref 4.0–10.5)

## 2020-06-16 LAB — LIPASE: Lipase: 39 U/L (ref 11.0–59.0)

## 2020-06-17 ENCOUNTER — Other Ambulatory Visit (INDEPENDENT_AMBULATORY_CARE_PROVIDER_SITE_OTHER): Payer: BC Managed Care – PPO

## 2020-06-17 DIAGNOSIS — R739 Hyperglycemia, unspecified: Secondary | ICD-10-CM | POA: Diagnosis not present

## 2020-06-17 LAB — HEMOGLOBIN A1C: Hgb A1c MFr Bld: 5.7 % (ref 4.6–6.5)

## 2020-07-08 ENCOUNTER — Encounter: Payer: Self-pay | Admitting: Physician Assistant

## 2020-07-08 ENCOUNTER — Ambulatory Visit (INDEPENDENT_AMBULATORY_CARE_PROVIDER_SITE_OTHER): Payer: BC Managed Care – PPO | Admitting: Physician Assistant

## 2020-07-08 VITALS — BP 124/68 | HR 80 | Ht 64.0 in | Wt 139.6 lb

## 2020-07-08 DIAGNOSIS — K219 Gastro-esophageal reflux disease without esophagitis: Secondary | ICD-10-CM

## 2020-07-08 DIAGNOSIS — R1012 Left upper quadrant pain: Secondary | ICD-10-CM | POA: Diagnosis not present

## 2020-07-08 NOTE — Patient Instructions (Addendum)
If you are age 49 or older, your body mass index should be between 23-30. Your Body mass index is 23.96 kg/m. If this is out of the aforementioned range listed, please consider follow up with your Primary Care Provider.  If you are age 36 or younger, your body mass index should be between 19-25. Your Body mass index is 23.96 kg/m. If this is out of the aformentioned range listed, please consider follow up with your Primary Care Provider.    Follow-up as needed.   Thank you for choosing me and [facility].  Hyacinth Meeker PA
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
## 2020-07-28 ENCOUNTER — Ambulatory Visit: Payer: BC Managed Care – PPO | Admitting: Medical

## 2020-07-28 ENCOUNTER — Other Ambulatory Visit: Payer: Self-pay

## 2020-07-28 ENCOUNTER — Other Ambulatory Visit: Payer: Self-pay | Admitting: Medical

## 2020-07-28 VITALS — BP 131/76 | HR 69 | Resp 18 | Ht 64.0 in | Wt 137.8 lb

## 2020-07-28 DIAGNOSIS — R109 Unspecified abdominal pain: Secondary | ICD-10-CM

## 2020-07-28 DIAGNOSIS — K219 Gastro-esophageal reflux disease without esophagitis: Secondary | ICD-10-CM | POA: Diagnosis not present

## 2020-07-28 DIAGNOSIS — R1032 Left lower quadrant pain: Secondary | ICD-10-CM

## 2020-07-28 MED ORDER — FAMOTIDINE 20 MG PO TABS
20.0000 mg | ORAL_TABLET | Freq: Every day | ORAL | 0 refills | Status: DC
Start: 2020-07-28 — End: 2020-07-28

## 2020-07-28 MED FILL — FAMOTIDINE 20 MG TABLET: 20 | 30 days supply | Qty: 30 | Fill #0

## 2020-07-28 NOTE — Patient Instructions (Signed)
You have a history of chronic abdomen pain intermittently for years.  Negative colonoscopy and negative EGD.  Recent treatment advised by GI advised by GI has not resolved symptoms.  Report minimal improvement presently.  Pain level was a 5 out of 10 last night keeping you up for many hours.  I do think getting CT of abdomen and pelvis would be beneficial to rule out possible diverticulitis.  Study prior studies were negative but diverticuli could have developed during the interim?  We will get CBC, CMP, H. pylori antibody studies and lipase.  Continue Nexium 20 mg twice daily.  Adding famotidine 20 mg daily.  We will follow lab results and if studies negative and pain persisting then will contact GI office and have patient follow-up with them.  If severe pain/changing signs symptoms prior to CT then be evaluated in ED.  Will wait for prior authorization to be done before we do study.
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
## 2020-07-29 LAB — COMPREHENSIVE METABOLIC PANEL
ALT: 30 U/L (ref 0–53)
AST: 17 U/L (ref 0–37)
Albumin: 4.6 g/dL (ref 3.5–5.2)
Alkaline Phosphatase: 111 U/L (ref 39–117)
BUN: 15 mg/dL (ref 6–23)
CO2: 30 mEq/L (ref 19–32)
Calcium: 10.1 mg/dL (ref 8.4–10.5)
Chloride: 102 mEq/L (ref 96–112)
Creatinine, Ser: 1.27 mg/dL (ref 0.40–1.50)
GFR: 66.32 mL/min (ref >60.00)
Glucose, Bld: 121 mg/dL — ABNORMAL HIGH (ref 70–99)
Potassium: 3.7 mEq/L (ref 3.5–5.1)
Sodium: 137 mEq/L (ref 135–145)
Total Bilirubin: 1.1 mg/dL (ref 0.2–1.2)
Total Protein: 7.7 g/dL (ref 6.0–8.3)

## 2020-07-29 LAB — LIPASE: Lipase: 33 U/L (ref 11.0–59.0)

## 2020-07-29 LAB — CBC WITH DIFFERENTIAL/PLATELET
Basophils Absolute: 0.1 10*3/uL (ref 0.0–0.1)
Basophils Relative: 1.1 % (ref 0.0–3.0)
Eosinophils Absolute: 0 10*3/uL (ref 0.0–0.7)
Eosinophils Relative: 0.3 % (ref 0.0–5.0)
HCT: 45.3 % (ref 39.0–52.0)
Hemoglobin: 15.3 g/dL (ref 13.0–17.0)
Lymphocytes Relative: 32.2 % (ref 12.0–46.0)
Lymphs Abs: 2.9 10*3/uL (ref 0.7–4.0)
MCHC: 33.7 g/dL (ref 30.0–36.0)
MCV: 89.8 fl (ref 78.0–100.0)
Monocytes Absolute: 0.6 10*3/uL (ref 0.1–1.0)
Monocytes Relative: 6.8 % (ref 3.0–12.0)
Neutro Abs: 5.3 10*3/uL (ref 1.4–7.7)
Neutrophils Relative %: 59.6 % (ref 43.0–77.0)
Platelets: 208 10*3/uL (ref 150.0–400.0)
RBC: 5.04 Mil/uL (ref 4.22–5.81)
RDW: 12.9 % (ref 11.5–15.5)
WBC: 8.9 10*3/uL (ref 4.0–10.5)

## 2020-07-30 ENCOUNTER — Ambulatory Visit (HOSPITAL_BASED_OUTPATIENT_CLINIC_OR_DEPARTMENT_OTHER): Admission: RE | Admit: 2020-07-30 | Payer: BC Managed Care – PPO | Source: Ambulatory Visit

## 2020-07-30 ENCOUNTER — Other Ambulatory Visit (HOSPITAL_BASED_OUTPATIENT_CLINIC_OR_DEPARTMENT_OTHER): Payer: BC Managed Care – PPO

## 2020-07-30 ENCOUNTER — Encounter: Payer: Self-pay | Admitting: Medical

## 2020-07-30 LAB — H PYLORI, IGM, IGG, IGA AB
H pylori, IgM Abs: <9 units (ref 0.0–8.9)
H. pylori, IgA Abs: <9 units (ref 0.0–8.9)
H. pylori, IgG AbS: 0.33 Index Value (ref 0.00–0.79)

## 2020-07-30 MED FILL — ESOMEPRAZOLE MAGNESIUM 20 M: 20 | 30 days supply | Qty: 60 | Fill #1

## 2020-08-02 ENCOUNTER — Other Ambulatory Visit (HOSPITAL_BASED_OUTPATIENT_CLINIC_OR_DEPARTMENT_OTHER): Payer: BC Managed Care – PPO

## 2020-08-02 ENCOUNTER — Ambulatory Visit (HOSPITAL_BASED_OUTPATIENT_CLINIC_OR_DEPARTMENT_OTHER)
Admission: RE | Admit: 2020-08-02 | Discharge: 2020-08-02 | Disposition: A | Discharge status: Home / Self Care | Payer: BC Managed Care – PPO | Source: Ambulatory Visit | Attending: Medical | Admitting: Medical

## 2020-08-02 ENCOUNTER — Other Ambulatory Visit: Payer: Self-pay

## 2020-08-02 DIAGNOSIS — R1032 Left lower quadrant pain: Secondary | ICD-10-CM | POA: Insufficient documentation

## 2020-08-02 MED ORDER — IOHEXOL 300 MG/ML  SOLN
100.0000 mL | Freq: Once | INTRAMUSCULAR | Status: AC | PRN
  Administered 2020-08-02: 100 mL via INTRAVENOUS

## 2020-09-02 MED FILL — ESOMEPRAZOLE MAGNESIUM 20 M: 20 | 30 days supply | Qty: 60 | Fill #2

## 2020-09-20 ENCOUNTER — Ambulatory Visit: Payer: BC Managed Care – PPO | Attending: Family

## 2020-09-20 DIAGNOSIS — Z23 Encounter for immunization: Secondary | ICD-10-CM

## 2020-10-14 ENCOUNTER — Other Ambulatory Visit (HOSPITAL_BASED_OUTPATIENT_CLINIC_OR_DEPARTMENT_OTHER): Payer: Self-pay

## 2020-10-14 MED FILL — Esomeprazole Magnesium Cap Delayed Release 20 MG (Base Eq): ORAL | 30 days supply | Qty: 60 | Fill #0 | Status: AC

## 2020-11-05 NOTE — Progress Notes (Signed)
   Covid-19 Vaccination Clinic  Name:  Craig Ramos    MRN: [id number] DOB: 26-Apr-1971  11/05/2020  [name] was observed post Covid-19 immunization for 15 minutes without incident. He was provided with Vaccine Information Sheet and instruction to access the V-Safe system.   [name] was instructed to call 911 with any severe reactions post vaccine: Marland Kitchen Difficulty breathing  . Swelling of face and throat  . A fast heartbeat  . A bad rash all over body  . Dizziness and weakness   Immunizations Administered    Name Date Dose VIS Date Route   Pfizer COVID-19 Vaccine 09/20/2020  9:00 AM 0.3 mL 03/31/2020 Intramuscular   Manufacturer: [employer], [employer]   Lot: [id number]   NDC: [address]-5

## 2020-11-21 MED FILL — Esomeprazole Magnesium Cap Delayed Release 20 MG (Base Eq): ORAL | 30 days supply | Qty: 60 | Fill #1 | Status: AC

## 2020-11-22 ENCOUNTER — Other Ambulatory Visit (HOSPITAL_BASED_OUTPATIENT_CLINIC_OR_DEPARTMENT_OTHER): Payer: Self-pay

## 2021-01-31 MED FILL — Esomeprazole Magnesium Cap Delayed Release 20 MG (Base Eq): ORAL | 30 days supply | Qty: 60 | Fill #2 | Status: AC

## 2021-02-01 ENCOUNTER — Other Ambulatory Visit (HOSPITAL_BASED_OUTPATIENT_CLINIC_OR_DEPARTMENT_OTHER): Payer: Self-pay

## 2021-03-02 ENCOUNTER — Other Ambulatory Visit: Payer: Self-pay

## 2021-03-02 ENCOUNTER — Ambulatory Visit: Payer: BC Managed Care – PPO | Admitting: Medical

## 2021-03-02 VITALS — BP 123/80 | HR 72 | Temp 97.9°F | Resp 18 | Ht 64.0 in | Wt 137.2 lb

## 2021-03-02 DIAGNOSIS — R350 Frequency of micturition: Secondary | ICD-10-CM

## 2021-03-02 DIAGNOSIS — R300 Dysuria: Secondary | ICD-10-CM | POA: Diagnosis not present

## 2021-03-02 LAB — POC URINALSYSI DIPSTICK (AUTOMATED)
Bilirubin, UA: NEGATIVE
Glucose, UA: NEGATIVE
Ketones, UA: NEGATIVE
Leukocytes, UA: NEGATIVE
Nitrite, UA: NEGATIVE
Protein, UA: NEGATIVE
Spec Grav, UA: >=1.03 — AB (ref 1.010–1.025)
Urobilinogen, UA: 0.2 E.U./dL
pH, UA: 6 (ref 5.0–8.0)

## 2021-03-02 NOTE — Patient Instructions (Signed)
For frequent urination and dysuria recently will get poct ua, psa and urine culture. Will follow results and then decide if tx needed. If not urine infection and normal psa. Might rx flomax pending urology referral.  Referral to urologist placed again.  Follow up 2-3 weeks or sooner if needed.
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
## 2021-03-03 LAB — URINE CULTURE
MICRO NUMBER:: 12404082
SPECIMEN QUALITY:: ADEQUATE

## 2021-03-03 LAB — PSA: PSA: 0.78 ng/mL (ref 0.10–4.00)

## 2021-03-04 ENCOUNTER — Other Ambulatory Visit (HOSPITAL_BASED_OUTPATIENT_CLINIC_OR_DEPARTMENT_OTHER): Payer: Self-pay

## 2021-03-04 MED ORDER — TAMSULOSIN HCL 0.4 MG PO CAPS
0.4000 mg | ORAL_CAPSULE | Freq: Every day | ORAL | 3 refills | Status: DC
  Filled 2021-03-04: qty 30, 30d supply, fill #0
  Filled 2021-04-01: qty 30, 30d supply, fill #1

## 2021-03-04 NOTE — Addendum Note (Signed)
Addended by: Gwenevere Abbot on: 03/04/2021 08:03 AM   Modules accepted: Orders

## 2021-03-07 ENCOUNTER — Encounter: Payer: Self-pay | Admitting: Medical

## 2021-04-01 ENCOUNTER — Other Ambulatory Visit (HOSPITAL_BASED_OUTPATIENT_CLINIC_OR_DEPARTMENT_OTHER): Payer: Self-pay

## 2021-04-01 MED FILL — Esomeprazole Magnesium Cap Delayed Release 20 MG (Base Eq): ORAL | 30 days supply | Qty: 60 | Fill #3 | Status: AC

## 2021-04-21 ENCOUNTER — Other Ambulatory Visit (HOSPITAL_BASED_OUTPATIENT_CLINIC_OR_DEPARTMENT_OTHER): Payer: Self-pay

## 2021-04-21 MED ORDER — DOXYCYCLINE HYCLATE 100 MG PO TABS
ORAL_TABLET | ORAL | 0 refills | Status: DC
  Filled 2021-04-21: qty 60, 30d supply, fill #0

## 2021-04-21 MED ORDER — MELOXICAM 15 MG PO TABS
ORAL_TABLET | ORAL | 1 refills | Status: DC
  Filled 2021-04-21: qty 30, 30d supply, fill #0

## 2021-05-25 ENCOUNTER — Other Ambulatory Visit (HOSPITAL_BASED_OUTPATIENT_CLINIC_OR_DEPARTMENT_OTHER): Payer: Self-pay

## 2021-05-25 MED FILL — Esomeprazole Magnesium Cap Delayed Release 20 MG (Base Eq): ORAL | 30 days supply | Qty: 60 | Fill #4 | Status: AC

## 2021-07-18 ENCOUNTER — Other Ambulatory Visit (HOSPITAL_BASED_OUTPATIENT_CLINIC_OR_DEPARTMENT_OTHER): Payer: Self-pay

## 2021-07-18 ENCOUNTER — Other Ambulatory Visit: Payer: Self-pay | Admitting: Medical

## 2021-07-18 MED ORDER — ESOMEPRAZOLE MAGNESIUM 20 MG PO CPDR
DELAYED_RELEASE_CAPSULE | ORAL | 0 refills | Status: DC
  Filled 2021-07-18: qty 60, 30d supply, fill #0

## 2021-08-15 ENCOUNTER — Ambulatory Visit (INDEPENDENT_AMBULATORY_CARE_PROVIDER_SITE_OTHER): Payer: BC Managed Care – PPO | Admitting: Medical

## 2021-08-15 ENCOUNTER — Other Ambulatory Visit (HOSPITAL_BASED_OUTPATIENT_CLINIC_OR_DEPARTMENT_OTHER): Payer: Self-pay

## 2021-08-15 VITALS — BP 120/80 | HR 77 | Temp 98.2°F | Resp 18 | Ht 64.0 in | Wt 141.0 lb

## 2021-08-15 DIAGNOSIS — N401 Enlarged prostate with lower urinary tract symptoms: Secondary | ICD-10-CM

## 2021-08-15 DIAGNOSIS — Z0000 Encounter for general adult medical examination without abnormal findings: Secondary | ICD-10-CM

## 2021-08-15 DIAGNOSIS — R350 Frequency of micturition: Secondary | ICD-10-CM

## 2021-08-15 DIAGNOSIS — N419 Inflammatory disease of prostate, unspecified: Secondary | ICD-10-CM | POA: Diagnosis not present

## 2021-08-15 LAB — POC URINALSYSI DIPSTICK (AUTOMATED)
Bilirubin, UA: NEGATIVE
Blood, UA: NEGATIVE
Glucose, UA: NEGATIVE
Ketones, UA: NEGATIVE
Leukocytes, UA: NEGATIVE
Nitrite, UA: NEGATIVE
Protein, UA: NEGATIVE
Spec Grav, UA: 1.015 (ref 1.010–1.025)
Urobilinogen, UA: 0.2 E.U./dL
pH, UA: 6 (ref 5.0–8.0)

## 2021-08-15 MED ORDER — TAMSULOSIN HCL 0.4 MG PO CAPS
0.4000 mg | ORAL_CAPSULE | Freq: Every day | ORAL | 0 refills | Status: DC
  Filled 2021-08-15: qty 30, 30d supply, fill #0

## 2021-08-15 MED ORDER — DOXYCYCLINE HYCLATE 100 MG PO TABS
100.0000 mg | ORAL_TABLET | Freq: Two times a day (BID) | ORAL | 0 refills | Status: DC
  Filled 2021-08-15: qty 20, 10d supply, fill #0
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
## 2021-08-15 NOTE — Patient Instructions (Addendum)
For you wellness exam today I have ordered cbc, cmp and lipid panel. ? ?Vaccines declined. ? ?Recommend exercise and healthy diet. ? ?We will let you know lab results as they come in. ? ?Follow up date appointment will be determined after lab review.   ? ? ?Frequent urination, dysuria, hx of bph and prostatitis. Ordering ua, psa and urine culture. Rx flomax and doxycycline. Follow studies and may refer you back to urologist. ? ?For upcoming allergies in about one week. Can use xyzal and flonase otc. ? ?Preventive Care 24-14 Years Old, Male ?Preventive care refers to lifestyle choices and visits with your health care provider that can promote health and wellness. Preventive care visits are also called wellness exams. ?What can I expect for my preventive care visit? ?Counseling ?During your preventive care visit, your health care provider may ask about your: ?Medical history, including: ?Past medical problems. ?Family medical history. ?Current health, including: ?Emotional well-being. ?Home life and relationship well-being. ?Sexual activity. ?Lifestyle, including: ?Alcohol, nicotine or tobacco, and drug use. ?Access to firearms. ?Diet, exercise, and sleep habits. ?Safety issues such as seatbelt and bike helmet use. ?Sunscreen use. ?Work and work Astronomer. ?Physical exam ?Your health care provider will check your: ?Height and weight. These may be used to calculate your BMI (body mass index). BMI is a measurement that tells if you are at a healthy weight. ?Waist circumference. This measures the distance around your waistline. This measurement also tells if you are at a healthy weight and may help predict your risk of certain diseases, such as type 2 diabetes and high blood pressure. ?Heart rate and blood pressure. ?Body temperature. ?Skin for abnormal spots. ?What immunizations do I need? ?Vaccines are usually given at various ages, according to a schedule. Your health care provider will recommend vaccines for you  based on your age, medical history, and lifestyle or other factors, such as travel or where you work. ?What tests do I need? ?Screening ?Your health care provider may recommend screening tests for certain conditions. This may include: ?Lipid and cholesterol levels. ?Diabetes screening. This is done by checking your blood sugar (glucose) after you have not eaten for a while (fasting). ?Hepatitis B test. ?Hepatitis C test. ?HIV (human immunodeficiency virus) test. ?STI (sexually transmitted infection) testing, if you are at risk. ?Lung cancer screening. ?Prostate cancer screening. ?Colorectal cancer screening. ?Talk with your health care provider about your test results, treatment options, and if necessary, the need for more tests. ?Follow these instructions at home: ?Eating and drinking ? ?Eat a diet that includes fresh fruits and vegetables, whole grains, lean protein, and low-fat dairy products. ?Take vitamin and mineral supplements as recommended by your health care provider. ?Do not drink alcohol if your health care provider tells you not to drink. ?If you drink alcohol: ?Limit how much you have to 0-2 drinks a day. ?Know how much alcohol is in your drink. In the [place] one drink equals one 12 oz bottle of beer (355 mL), one 5 oz glass of wine (148 mL), or one 1? oz glass of hard liquor (44 mL). ?Lifestyle ?Brush your teeth every morning and night with fluoride toothpaste. Floss one time each day. ?Exercise for at least 30 minutes 5 or more days each week. ?Do not use any products that contain nicotine or tobacco. These products include cigarettes, chewing tobacco, and vaping devices, such as e-cigarettes. If you need help quitting, ask your health care provider. ?Do not use drugs. ?If you are sexually active, practice safe  sex. Use a condom or other form of protection to prevent STIs. ?Take aspirin only as told by your health care provider. Make sure that you understand how much to take and what form to take.  Work with your health care provider to find out whether it is safe and beneficial for you to take aspirin daily. ?Find healthy ways to manage stress, such as: ?Meditation, yoga, or listening to music. ?Journaling. ?Talking to a trusted person. ?Spending time with friends and family. ?Minimize exposure to UV radiation to reduce your risk of skin cancer. ?Safety ?Always wear your seat belt while driving or riding in a vehicle. ?Do not drive: ?If you have been drinking alcohol. Do not ride with someone who has been drinking. ?When you are tired or distracted. ?While texting. ?If you have been using any mind-altering substances or drugs. ?Wear a helmet and other protective equipment during sports activities. ?If you have firearms in your house, make sure you follow all gun safety procedures. ?What's next? ?Go to your health care provider once a year for an annual wellness visit. ?Ask your health care provider how often you should have your eyes and teeth checked. ?Stay up to date on all vaccines. ?This information is not intended to replace advice given to you by your health care provider. Make sure you discuss any questions you have with your health care provider. ?Document Revised: 11/24/2020 Document Reviewed: 11/24/2020 ?Elsevier Patient Education ? 2022 Elsevier Inc. ? ? ? ? ? ? ? ?

## 2021-08-16 LAB — CBC WITH DIFFERENTIAL/PLATELET
Basophils Absolute: 0.1 10*3/uL (ref 0.0–0.1)
Basophils Relative: 0.7 % (ref 0.0–3.0)
Eosinophils Absolute: 0.3 10*3/uL (ref 0.0–0.7)
Eosinophils Relative: 3.9 % (ref 0.0–5.0)
HCT: 42 % (ref 39.0–52.0)
Hemoglobin: 14.3 g/dL (ref 13.0–17.0)
Lymphocytes Relative: 42.1 % (ref 12.0–46.0)
Lymphs Abs: 3.3 10*3/uL (ref 0.7–4.0)
MCHC: 34 g/dL (ref 30.0–36.0)
MCV: 89.7 fl (ref 78.0–100.0)
Monocytes Absolute: 0.7 10*3/uL (ref 0.1–1.0)
Monocytes Relative: 9.4 % (ref 3.0–12.0)
Neutro Abs: 3.5 10*3/uL (ref 1.4–7.7)
Neutrophils Relative %: 43.9 % (ref 43.0–77.0)
Platelets: 196 10*3/uL (ref 150.0–400.0)
RBC: 4.68 Mil/uL (ref 4.22–5.81)
RDW: 13.3 % (ref 11.5–15.5)
WBC: 7.9 10*3/uL (ref 4.0–10.5)

## 2021-08-16 LAB — COMPREHENSIVE METABOLIC PANEL
ALT: 30 U/L (ref 0–53)
AST: 22 U/L (ref 0–37)
Albumin: 4.6 g/dL (ref 3.5–5.2)
Alkaline Phosphatase: 101 U/L (ref 39–117)
BUN: 19 mg/dL (ref 6–23)
CO2: 30 mEq/L (ref 19–32)
Calcium: 9.8 mg/dL (ref 8.4–10.5)
Chloride: 102 mEq/L (ref 96–112)
Creatinine, Ser: 1.21 mg/dL (ref 0.40–1.50)
GFR: 69.77 mL/min (ref >60.00)
Glucose, Bld: 91 mg/dL (ref 70–99)
Potassium: 3.8 mEq/L (ref 3.5–5.1)
Sodium: 139 mEq/L (ref 135–145)
Total Bilirubin: 1.3 mg/dL — ABNORMAL HIGH (ref 0.2–1.2)
Total Protein: 7.3 g/dL (ref 6.0–8.3)

## 2021-08-16 LAB — LIPID PANEL
Cholesterol: 160 mg/dL (ref 0–200)
HDL: 50.6 mg/dL (ref >39.00)
LDL Cholesterol: 79 mg/dL (ref 0–99)
NonHDL: 109.09
Total CHOL/HDL Ratio: 3
Triglycerides: 149 mg/dL (ref 0.0–149.0)
VLDL: 29.8 mg/dL (ref 0.0–40.0)

## 2021-08-16 LAB — PSA: PSA: 0.55 ng/mL (ref 0.10–4.00)

## 2021-08-17 LAB — URINE CULTURE
MICRO NUMBER:: 13092024
Result:: NO GROWTH
SPECIMEN QUALITY:: ADEQUATE

## 2021-09-11 ENCOUNTER — Other Ambulatory Visit: Payer: Self-pay | Admitting: Medical

## 2021-09-12 ENCOUNTER — Other Ambulatory Visit (HOSPITAL_BASED_OUTPATIENT_CLINIC_OR_DEPARTMENT_OTHER): Payer: Self-pay

## 2021-09-12 MED ORDER — ESOMEPRAZOLE MAGNESIUM 20 MG PO CPDR
DELAYED_RELEASE_CAPSULE | ORAL | 0 refills | Status: DC
  Filled 2021-09-12: qty 60, 30d supply, fill #0

## 2021-09-12 MED ORDER — TAMSULOSIN HCL 0.4 MG PO CAPS
0.4000 mg | ORAL_CAPSULE | Freq: Every day | ORAL | 0 refills | Status: DC
  Filled 2021-09-12: qty 30, 30d supply, fill #0

## 2021-10-10 ENCOUNTER — Other Ambulatory Visit: Payer: Self-pay | Admitting: Medical

## 2021-10-11 ENCOUNTER — Other Ambulatory Visit (HOSPITAL_BASED_OUTPATIENT_CLINIC_OR_DEPARTMENT_OTHER): Payer: Self-pay

## 2021-10-11 MED ORDER — TAMSULOSIN HCL 0.4 MG PO CAPS
0.4000 mg | ORAL_CAPSULE | Freq: Every day | ORAL | 0 refills | Status: DC
  Filled 2021-10-11: qty 30, 30d supply, fill #0

## 2021-11-10 ENCOUNTER — Other Ambulatory Visit: Payer: Self-pay | Admitting: Medical

## 2021-11-11 ENCOUNTER — Other Ambulatory Visit (HOSPITAL_BASED_OUTPATIENT_CLINIC_OR_DEPARTMENT_OTHER): Payer: Self-pay

## 2021-11-11 MED ORDER — TAMSULOSIN HCL 0.4 MG PO CAPS
0.4000 mg | ORAL_CAPSULE | Freq: Every day | ORAL | 0 refills | Status: DC
  Filled 2021-11-11: qty 30, 30d supply, fill #0

## 2021-12-18 ENCOUNTER — Other Ambulatory Visit: Payer: Self-pay | Admitting: Medical

## 2021-12-19 ENCOUNTER — Other Ambulatory Visit (HOSPITAL_BASED_OUTPATIENT_CLINIC_OR_DEPARTMENT_OTHER): Payer: Self-pay

## 2021-12-19 MED ORDER — TAMSULOSIN HCL 0.4 MG PO CAPS
0.4000 mg | ORAL_CAPSULE | Freq: Every day | ORAL | 0 refills | Status: DC
  Filled 2021-12-19: qty 30, 30d supply, fill #0

## 2022-01-15 ENCOUNTER — Other Ambulatory Visit: Payer: Self-pay | Admitting: Medical

## 2022-01-16 ENCOUNTER — Other Ambulatory Visit (HOSPITAL_BASED_OUTPATIENT_CLINIC_OR_DEPARTMENT_OTHER): Payer: Self-pay

## 2022-01-16 MED ORDER — TAMSULOSIN HCL 0.4 MG PO CAPS
0.4000 mg | ORAL_CAPSULE | Freq: Every day | ORAL | 0 refills | Status: DC
  Filled 2022-01-16: qty 90, 90d supply, fill #0

## 2022-04-26 ENCOUNTER — Other Ambulatory Visit (HOSPITAL_BASED_OUTPATIENT_CLINIC_OR_DEPARTMENT_OTHER): Payer: Self-pay

## 2022-04-26 ENCOUNTER — Other Ambulatory Visit: Payer: Self-pay | Admitting: Medical

## 2022-04-26 MED ORDER — TAMSULOSIN HCL 0.4 MG PO CAPS
0.4000 mg | ORAL_CAPSULE | Freq: Every day | ORAL | 0 refills | Status: DC
  Filled 2022-04-26: qty 90, 90d supply, fill #0

## 2022-07-26 ENCOUNTER — Other Ambulatory Visit: Payer: Self-pay | Admitting: Medical

## 2022-07-26 ENCOUNTER — Other Ambulatory Visit (HOSPITAL_BASED_OUTPATIENT_CLINIC_OR_DEPARTMENT_OTHER): Payer: Self-pay

## 2022-07-26 MED ORDER — TAMSULOSIN HCL 0.4 MG PO CAPS
0.4000 mg | ORAL_CAPSULE | Freq: Every day | ORAL | 0 refills | Status: DC
  Filled 2022-07-26: qty 90, 90d supply, fill #0

## 2022-09-12 ENCOUNTER — Ambulatory Visit (INDEPENDENT_AMBULATORY_CARE_PROVIDER_SITE_OTHER): Payer: No Typology Code available for payment source | Admitting: Medical

## 2022-09-12 VITALS — BP 126/76 | HR 59 | Resp 18 | Ht 64.0 in | Wt 138.0 lb

## 2022-09-12 DIAGNOSIS — Z125 Encounter for screening for malignant neoplasm of prostate: Secondary | ICD-10-CM | POA: Diagnosis not present

## 2022-09-12 DIAGNOSIS — Z0000 Encounter for general adult medical examination without abnormal findings: Secondary | ICD-10-CM

## 2022-09-12 LAB — LIPID PANEL
Cholesterol: 163 mg/dL (ref 0–200)
HDL: 56 mg/dL (ref >39.00)
LDL Cholesterol: 79 mg/dL (ref 0–99)
NonHDL: 106.9
Total CHOL/HDL Ratio: 3
Triglycerides: 138 mg/dL (ref 0.0–149.0)
VLDL: 27.6 mg/dL (ref 0.0–40.0)

## 2022-09-12 LAB — COMPREHENSIVE METABOLIC PANEL
ALT: 18 U/L (ref 0–53)
AST: 19 U/L (ref 0–37)
Albumin: 4.3 g/dL (ref 3.5–5.2)
Alkaline Phosphatase: 85 U/L (ref 39–117)
BUN: 14 mg/dL (ref 6–23)
CO2: 30 mEq/L (ref 19–32)
Calcium: 9.5 mg/dL (ref 8.4–10.5)
Chloride: 104 mEq/L (ref 96–112)
Creatinine, Ser: 1.03 mg/dL (ref 0.40–1.50)
GFR: 84.01 mL/min (ref >60.00)
Glucose, Bld: 99 mg/dL (ref 70–99)
Potassium: 4.1 mEq/L (ref 3.5–5.1)
Sodium: 139 mEq/L (ref 135–145)
Total Bilirubin: 0.6 mg/dL (ref 0.2–1.2)
Total Protein: 7.2 g/dL (ref 6.0–8.3)

## 2022-09-12 LAB — CBC WITH DIFFERENTIAL/PLATELET
Basophils Absolute: 0.1 10*3/uL (ref 0.0–0.1)
Basophils Relative: 1 % (ref 0.0–3.0)
Eosinophils Absolute: 0.3 10*3/uL (ref 0.0–0.7)
Eosinophils Relative: 5.1 % — ABNORMAL HIGH (ref 0.0–5.0)
HCT: 43.4 % (ref 39.0–52.0)
Hemoglobin: 14.7 g/dL (ref 13.0–17.0)
Lymphocytes Relative: 41.8 % (ref 12.0–46.0)
Lymphs Abs: 2.6 10*3/uL (ref 0.7–4.0)
MCHC: 33.8 g/dL (ref 30.0–36.0)
MCV: 90.3 fl (ref 78.0–100.0)
Monocytes Absolute: 0.5 10*3/uL (ref 0.1–1.0)
Monocytes Relative: 8.7 % (ref 3.0–12.0)
Neutro Abs: 2.7 10*3/uL (ref 1.4–7.7)
Neutrophils Relative %: 43.4 % (ref 43.0–77.0)
Platelets: 217 10*3/uL (ref 150.0–400.0)
RBC: 4.8 Mil/uL (ref 4.22–5.81)
RDW: 13 % (ref 11.5–15.5)
WBC: 6.2 10*3/uL (ref 4.0–10.5)

## 2022-09-12 LAB — PSA: PSA: 1.3 ng/mL (ref 0.10–4.00)

## 2022-09-12 NOTE — Patient Instructions (Addendum)
For you wellness exam today I have ordered cbc, cmp, psa and  lipid panel.  Declines shingrix vaccine  Recommend exercise and healthy diet.  We will let you know lab results as they come in.  Follow up date appointment will be determined after lab review.    Craig Ramos like symptoms in past now resolved for about one year. No ppi use for one year. Let me know if returns.  BPH like symptoms improved. Discussed urologist note when you saw him. Since no symptoms you could do trial of stopping flomax. If urinary symptoms occur let me know.  Preventive Care 72-47 Years Old, Male Preventive care refers to lifestyle choices and visits with your health care provider that can promote health and wellness. Preventive care visits are also called wellness exams. What can I expect for my preventive care visit? Counseling During your preventive care visit, your health care provider may ask about your: Medical history, including: Past medical problems. Family medical history. Current health, including: Emotional well-being. Home life and relationship well-being. Sexual activity. Lifestyle, including: Alcohol, nicotine or tobacco, and drug use. Access to firearms. Diet, exercise, and sleep habits. Safety issues such as seatbelt and bike helmet use. Sunscreen use. Work and work Statistician. Physical exam Your health care provider will check your: Height and weight. These may be used to calculate your BMI (body mass index). BMI is a measurement that tells if you are at a healthy weight. Waist circumference. This measures the distance around your waistline. This measurement also tells if you are at a healthy weight and may help predict your risk of certain diseases, such as type 2 diabetes and high blood pressure. Heart rate and blood pressure. Body temperature. Skin for abnormal spots. What immunizations do I need?  Vaccines are usually given at various ages, according to a schedule. Your health  care provider will recommend vaccines for you based on your age, medical history, and lifestyle or other factors, such as travel or where you work. What tests do I need? Screening Your health care provider may recommend screening tests for certain conditions. This may include: Lipid and cholesterol levels. Diabetes screening. This is done by checking your blood sugar (glucose) after you have not eaten for a while (fasting). Hepatitis B test. Hepatitis C test. HIV (human immunodeficiency virus) test. STI (sexually transmitted infection) testing, if you are at risk. Lung cancer screening. Prostate cancer screening. Colorectal cancer screening. Talk with your health care provider about your test results, treatment options, and if necessary, the need for more tests. Follow these instructions at home: Eating and drinking  Eat a diet that includes fresh fruits and vegetables, whole grains, lean protein, and low-fat dairy products. Take vitamin and mineral supplements as recommended by your health care provider. Do not drink alcohol if your health care provider tells you not to drink. If you drink alcohol: Limit how much you have to 0-2 drinks a day. Know how much alcohol is in your drink. In the [place] one drink equals one 12 oz bottle of beer (355 mL), one 5 oz glass of wine (148 mL), or one 1 oz glass of hard liquor (44 mL). Lifestyle Brush your teeth every morning and night with fluoride toothpaste. Floss one time each day. Exercise for at least 30 minutes 5 or more days each week. Do not use any products that contain nicotine or tobacco. These products include cigarettes, chewing tobacco, and vaping devices, such as e-cigarettes. If you need help quitting, ask your health  care provider. Do not use drugs. If you are sexually active, practice safe sex. Use a condom or other form of protection to prevent STIs. Take aspirin only as told by your health care provider. Make sure that you  understand how much to take and what form to take. Work with your health care provider to find out whether it is safe and beneficial for you to take aspirin daily. Find healthy ways to manage stress, such as: Meditation, yoga, or listening to music. Journaling. Talking to a trusted person. Spending time with friends and family. Minimize exposure to UV radiation to reduce your risk of skin cancer. Safety Always wear your seat belt while driving or riding in a vehicle. Do not drive: If you have been drinking alcohol. Do not ride with someone who has been drinking. When you are tired or distracted. While texting. If you have been using any mind-altering substances or drugs. Wear a helmet and other protective equipment during sports activities. If you have firearms in your house, make sure you follow all gun safety procedures. What's next? Go to your health care provider once a year for an annual wellness visit. Ask your health care provider how often you should have your eyes and teeth checked. Stay up to date on all vaccines. This information is not intended to replace advice given to you by your health care provider. Make sure you discuss any questions you have with your health care provider. Document Revised: 11/24/2020 Document Reviewed: 11/24/2020 Elsevier Patient Education  [place].
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
## 2022-10-21 IMAGING — DX DG ABDOMEN 1V
1 series · 1 of 1 positions shown · non-contrast
Comparison: None.

CLINICAL DATA: Left abdominal pain.

EXAM:
ABDOMEN - 1 VIEW

[abdomen kub]
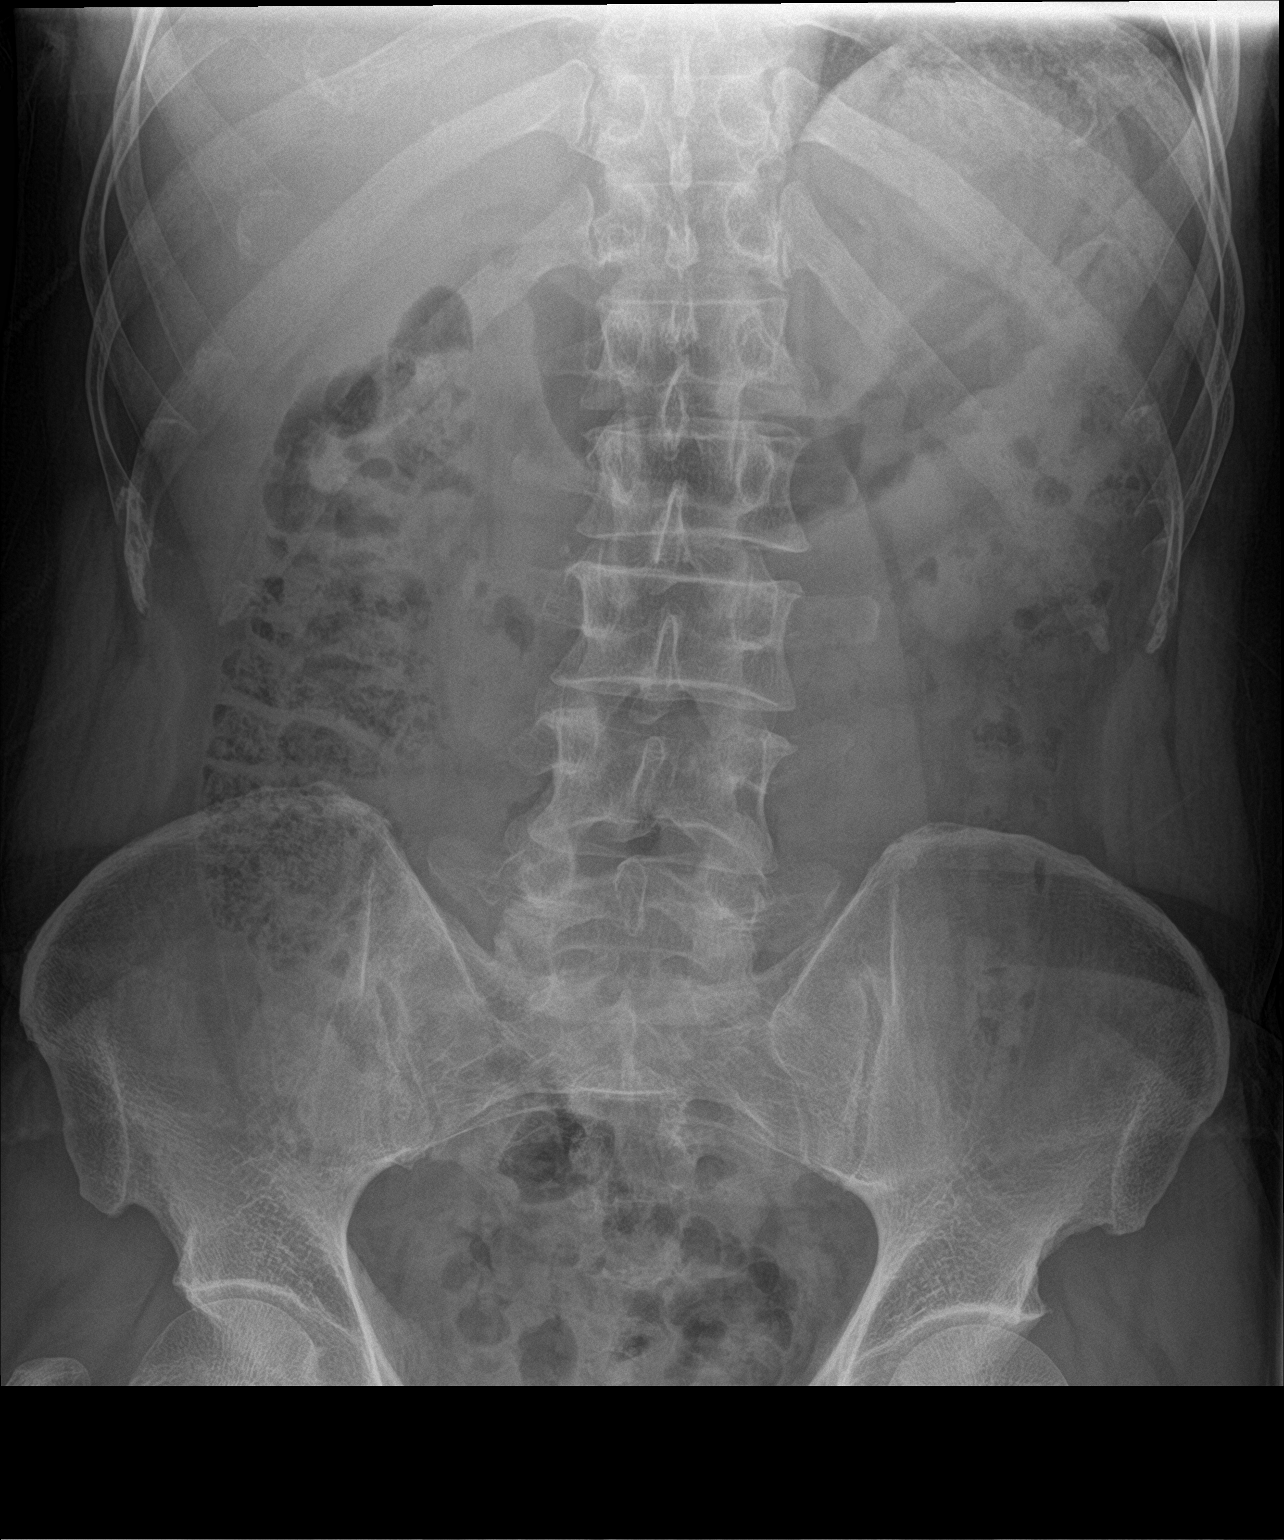

[1 of 1 positions shown; findings below may reference images not displayed]

FINDINGS: Nonobstructive bowel gas pattern. Moderate amount of stool in the
pelvis and right colon. Small calcifications in left abdomen and
difficult to exclude small renal calculi. Bone structures are
unremarkable.
IMPRESSION: 1. Nonobstructive bowel gas pattern.
2. Moderate stool burden.
3. Possible left renal calculi but limited evaluation due to
overlying bowel gas and stool.

## 2023-02-28 ENCOUNTER — Other Ambulatory Visit: Payer: Self-pay | Admitting: Medical

## 2023-03-01 ENCOUNTER — Other Ambulatory Visit (HOSPITAL_BASED_OUTPATIENT_CLINIC_OR_DEPARTMENT_OTHER): Payer: Self-pay

## 2023-03-01 MED ORDER — TAMSULOSIN HCL 0.4 MG PO CAPS
0.4000 mg | ORAL_CAPSULE | Freq: Every day | ORAL | 2 refills | Status: AC
  Filled 2023-03-01: qty 90, 90d supply, fill #0
  Filled 2023-06-15: qty 90, 90d supply, fill #1
  Filled 2023-11-15: qty 90, 90d supply, fill #2

## 2023-06-15 ENCOUNTER — Other Ambulatory Visit (HOSPITAL_BASED_OUTPATIENT_CLINIC_OR_DEPARTMENT_OTHER): Payer: Self-pay

## 2023-06-15 ENCOUNTER — Encounter: Payer: Self-pay | Admitting: Medical

## 2023-06-15 ENCOUNTER — Ambulatory Visit (HOSPITAL_BASED_OUTPATIENT_CLINIC_OR_DEPARTMENT_OTHER): Payer: 59

## 2023-06-15 ENCOUNTER — Other Ambulatory Visit: Payer: Self-pay | Admitting: Medical

## 2023-06-15 ENCOUNTER — Other Ambulatory Visit (HOSPITAL_BASED_OUTPATIENT_CLINIC_OR_DEPARTMENT_OTHER): Payer: 59

## 2023-06-15 ENCOUNTER — Ambulatory Visit (INDEPENDENT_AMBULATORY_CARE_PROVIDER_SITE_OTHER): Payer: 59 | Admitting: Medical

## 2023-06-15 ENCOUNTER — Ambulatory Visit (HOSPITAL_BASED_OUTPATIENT_CLINIC_OR_DEPARTMENT_OTHER)
Admission: RE | Admit: 2023-06-15 | Discharge: 2023-06-15 | Disposition: A | Discharge status: Home / Self Care | Payer: 59 | Source: Ambulatory Visit | Attending: Medical | Admitting: Medical

## 2023-06-15 VITALS — BP 136/80 | HR 78 | Temp 98.0°F | Resp 18 | Ht 64.0 in | Wt 130.0 lb

## 2023-06-15 DIAGNOSIS — R591 Generalized enlarged lymph nodes: Secondary | ICD-10-CM | POA: Insufficient documentation

## 2023-06-15 DIAGNOSIS — Z125 Encounter for screening for malignant neoplasm of prostate: Secondary | ICD-10-CM

## 2023-06-15 DIAGNOSIS — M25512 Pain in left shoulder: Secondary | ICD-10-CM | POA: Diagnosis not present

## 2023-06-15 DIAGNOSIS — E041 Nontoxic single thyroid nodule: Secondary | ICD-10-CM

## 2023-06-15 DIAGNOSIS — E0789 Other specified disorders of thyroid: Secondary | ICD-10-CM | POA: Insufficient documentation

## 2023-06-15 LAB — CBC WITH DIFFERENTIAL/PLATELET
Basophils Absolute: 0.1 10*3/uL (ref 0.0–0.1)
Basophils Relative: 0.9 % (ref 0.0–3.0)
Eosinophils Absolute: 0 10*3/uL (ref 0.0–0.7)
Eosinophils Relative: 0.2 % (ref 0.0–5.0)
HCT: 45 % (ref 39.0–52.0)
Hemoglobin: 15.2 g/dL (ref 13.0–17.0)
Lymphocytes Relative: 28.8 % (ref 12.0–46.0)
Lymphs Abs: 1.8 10*3/uL (ref 0.7–4.0)
MCHC: 33.9 g/dL (ref 30.0–36.0)
MCV: 91 fL (ref 78.0–100.0)
Monocytes Absolute: 0.5 10*3/uL (ref 0.1–1.0)
Monocytes Relative: 7.9 % (ref 3.0–12.0)
Neutro Abs: 3.8 10*3/uL (ref 1.4–7.7)
Neutrophils Relative %: 62.2 % (ref 43.0–77.0)
Platelets: 220 10*3/uL (ref 150.0–400.0)
RBC: 4.94 Mil/uL (ref 4.22–5.81)
RDW: 12.8 % (ref 11.5–15.5)
WBC: 6.1 10*3/uL (ref 4.0–10.5)

## 2023-06-15 LAB — PSA: PSA: 0.78 ng/mL (ref 0.10–4.00)

## 2023-06-15 LAB — T4, FREE: Free T4: 1.49 ng/dL (ref 0.60–1.60)

## 2023-06-15 LAB — TSH: TSH: 0.79 u[IU]/mL (ref 0.35–5.50)

## 2023-06-15 NOTE — Patient Instructions (Addendum)
 Left Thyroid  Nodule Patient reports a sensation of a fishbone in the throat and dry mouth(but did not eat fish just states the way it feels). Previous CT in 2019 identified a 6mm left thyroid  nodule, below the standard size threshold for follow-up. However, given the patient's new symptoms and palpable lymph node, further investigation is warranted. -Order thyroid  ultrasound to assess the nodule and surrounding area. -Order TSH and T4 labs to assess thyroid  function.  Left Shoulder Pain Chronic pain, worsened with heavy lifting and certain movements. Patient reports muscle atrophy in the left trapezius. -Refer to sports medicine for further evaluation and management.  Hypertension Blood pressure at visit was 136/80, which is acceptable. However, patient reports previous readings of 140/90. -Advise patient to monitor blood pressure at home when relaxed. If consistently over 140/90, consider starting antihypertensive medication.  General Health Maintenance -Order CBC to investigate slightly enlarged left submandibular lymph node. -Order PSA for age-related prostate screening. -Follow-up appointment to be scheduled based on lab results. If delay in sports medicine referral and shoulder pain worsens, consider prescribing an anti-inflammatory medication.  Can also get scheduled for wellness exam within next 1-3 months

## 2023-06-15 NOTE — Progress Notes (Signed)
   Subjective:    Patient ID: Craig Ramos, male    DOB: 01/29/1971, 53 y.o.   MRN: [id number]  HPI Discussed the use of AI scribe software for clinical note transcription with the patient, who gave verbal consent to proceed.  History of Present Illness   The patient presents with a history of neck discomfort and a left thyroid  nodule, initially identified in 2019. He reports a recurrence of neck swelling and a sensation akin to a 'piece of bone' in the throat, particularly noticeable when swallowing. This discomfort has been ongoing for approximately three to four months. The patient also reports a dry mouth and reduced saliva production.  In addition to the neck discomfort, the patient has been experiencing left shoulder pain for over a year. The pain intensifies when carrying heavy objects or playing with his dog. He has noticed a difference in muscle mass between the left and right trapezius, with the left appearing thinner.  The patient's medical follow-up was delayed due to a lapse in insurance coverage. He has not been taking any medication for the shoulder pain. He has not reported any tooth pain, pain when eating or chewing, or lower tooth pain. The patient is right-handed and denies any specific injury to the shoulder, indicating the pain came on gradually.        Review of Systems See hpi    Objective:   Physical Exam  General Mental Status- Alert. General Appearance- Not in acute distress.   Skin General: Color- Normal Color. Moisture- Normal Moisture.  Neck Carotid Arteries- Normal color. Moisture- Normal Moisture. No carotid bruits. No JVD.  Chest and Lung Exam Auscultation: Breath Sounds:-Normal.  Cardiovascular Auscultation:Rythm- Regular. Murmurs & Other Heart Sounds:Auscultation of the heart reveals- No Murmurs.  Abdomen Inspection:-Inspeection Normal. Palpation/Percussion:Note:No mass. Palpation and Percussion of the abdomen reveal- Non Tender, Non Distended  + BS, no rebound or guarding.   Neurologic Cranial Nerve exam:- CN III-XII intact(No nystagmus), symmetric smile. Strength:- 5/5 equal and symmetric strength both upper and lower extremities.       Assessment & Plan:  Assessment and Plan    Left Thyroid  Nodule Patient reports a sensation of a fishbone in the throat and dry mouth(but did not eat fish just states the way it feels). Previous CT in 2019 identified a 6mm left thyroid  nodule, below the standard size threshold for follow-up. However, given the patient's new symptoms and palpable lymph node, further investigation is warranted. -Order thyroid  ultrasound to assess the nodule and surrounding area. -Order TSH and T4 labs to assess thyroid  function.  Left Shoulder Pain Chronic pain, worsened with heavy lifting and certain movements. Patient reports muscle atrophy in the left trapezius. -Refer to sports medicine for further evaluation and management.  Hypertension Blood pressure at visit was 136/80, which is acceptable. However, patient reports previous readings of 140/90. -Advise patient to monitor blood pressure at home when relaxed. If consistently over 140/90, consider starting antihypertensive medication.  General Health Maintenance -Order CBC to investigate slightly enlarged left submandibular lymph node. -Order PSA for age-related prostate screening. -Follow-up appointment to be scheduled based on lab results. If delay in sports medicine referral and shoulder pain worsens, consider prescribing an anti-inflammatory medication.        Shelva Hetzer, PA-C

## 2023-06-18 ENCOUNTER — Ambulatory Visit: Payer: No Typology Code available for payment source | Admitting: Medical

## 2023-06-20 ENCOUNTER — Ambulatory Visit: Payer: 59 | Admitting: Family Medicine

## 2023-08-03 ENCOUNTER — Encounter: Payer: Self-pay | Admitting: Medical

## 2023-08-03 ENCOUNTER — Ambulatory Visit: Payer: 59 | Admitting: Medical

## 2023-08-03 VITALS — BP 112/80 | HR 71 | Resp 18 | Ht 64.0 in | Wt 135.0 lb

## 2023-08-03 DIAGNOSIS — R350 Frequency of micturition: Secondary | ICD-10-CM

## 2023-08-03 DIAGNOSIS — R3911 Hesitancy of micturition: Secondary | ICD-10-CM | POA: Diagnosis not present

## 2023-08-03 DIAGNOSIS — Z125 Encounter for screening for malignant neoplasm of prostate: Secondary | ICD-10-CM

## 2023-08-03 LAB — POC URINALSYSI DIPSTICK (AUTOMATED)
Bilirubin, UA: NEGATIVE
Blood, UA: NEGATIVE
Glucose, UA: NEGATIVE
Ketones, UA: NEGATIVE
Leukocytes, UA: NEGATIVE
Nitrite, UA: NEGATIVE
Protein, UA: NEGATIVE
Spec Grav, UA: <=1.005 — AB (ref 1.010–1.025)
Urobilinogen, UA: 0.2 U/dL
pH, UA: 6.5 (ref 5.0–8.0)

## 2023-08-03 LAB — PSA: PSA: 0.87 ng/mL (ref 0.10–4.00)

## 2023-08-03 NOTE — Patient Instructions (Signed)
 Urinary Symptoms frequency and hesitant urine flow (considrering probable related to bph) Recurrence of urinary symptoms including dysuria, straining, and frequency for the past 15 days. Patient has a history of similar symptoms and was previously evaluated by a urologist two years ago. Currently on Flomax 0.4mg  daily with no significant improvement. -Collect urine sample for culture to rule out infection. -Double Flomax dose to 0.8mg  daily. -Check PSA levels. -Send lab results to urologist, Dr. Modena Slater, for upcoming appointment on Wednesday. -Continue to monitor symptoms and follow up with urologist as scheduled.  Follow up date to be determined after lab review.
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
## 2023-08-04 LAB — URINE CULTURE
MICRO NUMBER:: 16113026
Result:: NO GROWTH
SPECIMEN QUALITY:: ADEQUATE

## 2023-11-16 ENCOUNTER — Other Ambulatory Visit (HOSPITAL_BASED_OUTPATIENT_CLINIC_OR_DEPARTMENT_OTHER): Payer: Self-pay

## 2023-11-23 ENCOUNTER — Other Ambulatory Visit (HOSPITAL_BASED_OUTPATIENT_CLINIC_OR_DEPARTMENT_OTHER): Payer: Self-pay
